# Patient Record
Sex: Male | Born: 1940 | Race: White | Hispanic: No | Marital: Married | Smoking: Former smoker
Health system: Southern US, Community
[De-identification: ages and names within clinical notes are randomized; demographics above are authoritative.]

## PROBLEM LIST (undated history)

## (undated) DIAGNOSIS — M1990 Unspecified osteoarthritis, unspecified site: Secondary | ICD-10-CM

## (undated) DIAGNOSIS — H269 Unspecified cataract: Secondary | ICD-10-CM

## (undated) DIAGNOSIS — M069 Rheumatoid arthritis, unspecified: Secondary | ICD-10-CM

## (undated) DIAGNOSIS — B019 Varicella without complication: Secondary | ICD-10-CM

## (undated) DIAGNOSIS — K219 Gastro-esophageal reflux disease without esophagitis: Secondary | ICD-10-CM

## (undated) DIAGNOSIS — E785 Hyperlipidemia, unspecified: Secondary | ICD-10-CM

## (undated) HISTORY — PX: CATARACT EXTRACTION: SUR2

## (undated) HISTORY — PX: TONSILLECTOMY: SUR1361

---

## 2006-09-24 ENCOUNTER — Ambulatory Visit: Payer: Self-pay | Admitting: Radiation Oncology

## 2008-03-21 ENCOUNTER — Emergency Department: Payer: Self-pay | Admitting: Emergency Medicine

## 2008-03-29 ENCOUNTER — Emergency Department: Payer: Self-pay | Admitting: Emergency Medicine

## 2009-04-19 ENCOUNTER — Ambulatory Visit: Payer: Self-pay | Admitting: Ophthalmology

## 2009-04-23 ENCOUNTER — Ambulatory Visit: Payer: Self-pay | Admitting: Ophthalmology

## 2011-11-13 ENCOUNTER — Ambulatory Visit: Payer: Self-pay | Admitting: Ophthalmology

## 2011-11-13 DIAGNOSIS — I499 Cardiac arrhythmia, unspecified: Secondary | ICD-10-CM

## 2011-11-28 ENCOUNTER — Ambulatory Visit: Payer: Self-pay | Admitting: Ophthalmology

## 2012-10-22 HISTORY — PX: TRIGGER FINGER RELEASE: SHX641

## 2013-10-19 ENCOUNTER — Emergency Department: Payer: Self-pay | Admitting: Emergency Medicine

## 2013-10-19 LAB — URINALYSIS, COMPLETE
Bilirubin,UR: NEGATIVE
Blood: NEGATIVE
Glucose,UR: NEGATIVE mg/dL (ref 0–75)
KETONE: NEGATIVE
Leukocyte Esterase: NEGATIVE
Nitrite: NEGATIVE
PROTEIN: NEGATIVE
Ph: 5 (ref 4.5–8.0)
Specific Gravity: 1.025 (ref 1.003–1.030)

## 2013-10-19 LAB — CBC WITH DIFFERENTIAL/PLATELET
Basophil #: 0.1 10*3/uL (ref 0.0–0.1)
Basophil %: 0.4 %
EOS PCT: 0.7 %
Eosinophil #: 0.1 10*3/uL (ref 0.0–0.7)
HCT: 49.3 % (ref 40.0–52.0)
HGB: 16.4 g/dL (ref 13.0–18.0)
Lymphocyte #: 1.4 10*3/uL (ref 1.0–3.6)
Lymphocyte %: 7.1 %
MCH: 31.5 pg (ref 26.0–34.0)
MCHC: 33.2 g/dL (ref 32.0–36.0)
MCV: 95 fL (ref 80–100)
Monocyte #: 1.2 x10 3/mm — ABNORMAL HIGH (ref 0.2–1.0)
Monocyte %: 6 %
Neutrophil #: 16.5 10*3/uL — ABNORMAL HIGH (ref 1.4–6.5)
Neutrophil %: 85.8 %
Platelet: 258 10*3/uL (ref 150–440)
RBC: 5.19 10*6/uL (ref 4.40–5.90)
RDW: 13.3 % (ref 11.5–14.5)
WBC: 19.2 10*3/uL — ABNORMAL HIGH (ref 3.8–10.6)

## 2013-10-19 LAB — COMPREHENSIVE METABOLIC PANEL
ALK PHOS: 98 U/L
Albumin: 4.2 g/dL (ref 3.4–5.0)
Anion Gap: 9 (ref 7–16)
BUN: 26 mg/dL — AB (ref 7–18)
Bilirubin,Total: 0.9 mg/dL (ref 0.2–1.0)
CHLORIDE: 106 mmol/L (ref 98–107)
Calcium, Total: 9.6 mg/dL (ref 8.5–10.1)
Co2: 25 mmol/L (ref 21–32)
Creatinine: 1.44 mg/dL — ABNORMAL HIGH (ref 0.60–1.30)
EGFR (African American): 55 — ABNORMAL LOW
EGFR (Non-African Amer.): 47 — ABNORMAL LOW
Glucose: 108 mg/dL — ABNORMAL HIGH (ref 65–99)
Osmolality: 285 (ref 275–301)
Potassium: 4 mmol/L (ref 3.5–5.1)
SGOT(AST): 33 U/L (ref 15–37)
SGPT (ALT): 31 U/L
Sodium: 140 mmol/L (ref 136–145)
Total Protein: 8.1 g/dL (ref 6.4–8.2)

## 2013-10-19 LAB — LIPASE, BLOOD: Lipase: 95 U/L (ref 73–393)

## 2013-10-19 LAB — CK: CK, Total: 166 U/L

## 2014-07-18 NOTE — Op Note (Signed)
PATIENT NAME:  [name] [name], [name] [name] MR#:  [id number] DATE OF BIRTH:  1940-07-29  DATE OF PROCEDURE:  11/28/2011  PREOPERATIVE DIAGNOSIS:  Cataract, right eye.   POSTOPERATIVE DIAGNOSIS:  Cataract, right eye.  PROCEDURE PERFORMED:  Extracapsular cataract extraction using phacoemulsification with placement of an Alcon [id number], 21.5-diopter posterior chamber lens, serial # [id number].  SURGEON:  Loura Back. Jensyn Shave, MD  ASSISTANT:  None.  ANESTHESIA:  4% lidocaine and 0.75% Marcaine in a 50/50 mixture with 10 units/mL of Hylenex added, given as a peribulbar.  ANESTHESIOLOGIST:  Dr. Kayleen Memos  COMPLICATIONS:  None.  ESTIMATED BLOOD LOSS:  Less than 1 mL.  DESCRIPTION OF PROCEDURE:  The patient was brought to the operating room and given a peribulbar block.  The patient was then prepped and draped in the usual fashion.  The vertical rectus muscles were imbricated using 5-0 silk sutures.  These sutures were then clamped to the sterile drapes as bridle sutures.  A limbal peritomy was performed extending two clock hours and hemostasis was obtained with cautery.  A partial thickness scleral groove was made at the surgical limbus and dissected anteriorly in a lamellar dissection using an Alcon crescent knife.  The anterior chamber was entered superonasally with a Superblade and through the lamellar dissection with a 2.6 mm keratome.  DisCoVisc was used to replace the aqueous and a continuous tear capsulorrhexis was carried out.  Hydrodissection and hydrodelineation were carried out with balanced salt and a 27 gauge canula.  The nucleus was rotated to confirm the effectiveness of the hydrodissection.  Phacoemulsification was carried out using a divide-and-conquer technique.  Total ultrasound time was 2 minutes and 5 seconds with an average power of 20 percent. CDE 42.03.  Irrigation/aspiration was used to remove the residual cortex.  DisCoVisc was used to inflate the capsule and the internal incision was  enlarged to 3 mm with the crescent knife.  The intraocular lens was folded and inserted into the capsular bag using the AcrySert delivery system.  Irrigation/aspiration was used to remove the residual DisCoVisc.  Miostat was injected into the anterior chamber through the paracentesis track to inflate the anterior chamber and induce miosis.  The wound was checked for leaks and none were found. The conjunctiva was closed with cautery and the bridle sutures were removed.  Two drops of 0.3% Vigamox were placed on the eye.   An eye shield was placed on the eye.  The patient was discharged to the recovery room in good condition.  ____________________________ Loura Back Kyrie Bun, MD sad:cms D: 11/28/2011 12:31:11 ET T: 11/28/2011 12:42:30 ET JOB#: [id number]  cc: Remo Lipps A. Malala Trenkamp, MD, <Dictator> Martie Lee MD ELECTRONICALLY SIGNED 12/08/2011 13:37

## 2015-01-04 ENCOUNTER — Other Ambulatory Visit: Payer: Self-pay | Admitting: Gastroenterology

## 2015-01-04 DIAGNOSIS — R634 Abnormal weight loss: Secondary | ICD-10-CM

## 2015-01-04 DIAGNOSIS — R1030 Lower abdominal pain, unspecified: Secondary | ICD-10-CM

## 2015-01-04 DIAGNOSIS — Z9889 Other specified postprocedural states: Secondary | ICD-10-CM

## 2015-01-10 ENCOUNTER — Ambulatory Visit
Admission: RE | Admit: 2015-01-10 | Discharge: 2015-01-10 | Disposition: A | Discharge status: Home / Self Care | Payer: Medicare Other | Source: Ambulatory Visit | Attending: Gastroenterology | Admitting: Gastroenterology

## 2015-01-10 DIAGNOSIS — R102 Pelvic and perineal pain: Secondary | ICD-10-CM | POA: Insufficient documentation

## 2015-01-10 DIAGNOSIS — Z9889 Other specified postprocedural states: Secondary | ICD-10-CM

## 2015-01-10 DIAGNOSIS — R634 Abnormal weight loss: Secondary | ICD-10-CM | POA: Insufficient documentation

## 2015-01-10 DIAGNOSIS — I714 Abdominal aortic aneurysm, without rupture: Secondary | ICD-10-CM | POA: Diagnosis not present

## 2015-01-10 DIAGNOSIS — R1030 Lower abdominal pain, unspecified: Secondary | ICD-10-CM

## 2015-01-10 MED ORDER — IOHEXOL 300 MG/ML  SOLN
100.0000 mL | Freq: Once | INTRAMUSCULAR | Status: AC | PRN
  Administered 2015-01-10: 100 mL via INTRAVENOUS

## 2015-04-05 ENCOUNTER — Encounter: Payer: Self-pay | Admitting: *Deleted

## 2015-04-06 ENCOUNTER — Ambulatory Visit: Payer: Medicare Other | Admitting: Anesthesiology

## 2015-04-06 ENCOUNTER — Ambulatory Visit
Admission: RE | Admit: 2015-04-06 | Discharge: 2015-04-06 | Disposition: A | Discharge status: Home Health | Payer: Medicare Other | Source: Ambulatory Visit | Attending: Gastroenterology | Admitting: Gastroenterology

## 2015-04-06 ENCOUNTER — Encounter
Admission: RE | Disposition: A | Discharge status: Home Health | Payer: Self-pay | Source: Ambulatory Visit | Attending: Gastroenterology

## 2015-04-06 ENCOUNTER — Encounter: Payer: Self-pay | Admitting: *Deleted

## 2015-04-06 DIAGNOSIS — Z87891 Personal history of nicotine dependence: Secondary | ICD-10-CM | POA: Diagnosis not present

## 2015-04-06 DIAGNOSIS — R1030 Lower abdominal pain, unspecified: Secondary | ICD-10-CM | POA: Diagnosis present

## 2015-04-06 DIAGNOSIS — D120 Benign neoplasm of cecum: Secondary | ICD-10-CM | POA: Insufficient documentation

## 2015-04-06 DIAGNOSIS — E785 Hyperlipidemia, unspecified: Secondary | ICD-10-CM | POA: Insufficient documentation

## 2015-04-06 DIAGNOSIS — D125 Benign neoplasm of sigmoid colon: Secondary | ICD-10-CM | POA: Insufficient documentation

## 2015-04-06 DIAGNOSIS — Z79899 Other long term (current) drug therapy: Secondary | ICD-10-CM | POA: Diagnosis not present

## 2015-04-06 DIAGNOSIS — M1990 Unspecified osteoarthritis, unspecified site: Secondary | ICD-10-CM | POA: Insufficient documentation

## 2015-04-06 DIAGNOSIS — K219 Gastro-esophageal reflux disease without esophagitis: Secondary | ICD-10-CM | POA: Diagnosis not present

## 2015-04-06 DIAGNOSIS — D122 Benign neoplasm of ascending colon: Secondary | ICD-10-CM | POA: Insufficient documentation

## 2015-04-06 DIAGNOSIS — M069 Rheumatoid arthritis, unspecified: Secondary | ICD-10-CM | POA: Insufficient documentation

## 2015-04-06 DIAGNOSIS — K5730 Diverticulosis of large intestine without perforation or abscess without bleeding: Secondary | ICD-10-CM | POA: Diagnosis not present

## 2015-04-06 DIAGNOSIS — R634 Abnormal weight loss: Secondary | ICD-10-CM | POA: Diagnosis not present

## 2015-04-06 DIAGNOSIS — D123 Benign neoplasm of transverse colon: Secondary | ICD-10-CM | POA: Insufficient documentation

## 2015-04-06 HISTORY — DX: Varicella without complication: B01.9

## 2015-04-06 HISTORY — DX: Unspecified cataract: H26.9

## 2015-04-06 HISTORY — PX: COLONOSCOPY WITH PROPOFOL: SHX5780

## 2015-04-06 HISTORY — DX: Rheumatoid arthritis, unspecified: M06.9

## 2015-04-06 HISTORY — DX: Gastro-esophageal reflux disease without esophagitis: K21.9

## 2015-04-06 HISTORY — DX: Unspecified osteoarthritis, unspecified site: M19.90

## 2015-04-06 HISTORY — DX: Hyperlipidemia, unspecified: E78.5

## 2015-04-06 SURGERY — COLONOSCOPY WITH PROPOFOL
Anesthesia: General

## 2015-04-06 MED ORDER — PROPOFOL 10 MG/ML IV BOLUS
INTRAVENOUS | Status: DC | PRN
  Administered 2015-04-06: 70 mg via INTRAVENOUS

## 2015-04-06 MED ORDER — LIDOCAINE HCL (CARDIAC) 20 MG/ML IV SOLN
INTRAVENOUS | Status: DC | PRN
  Administered 2015-04-06: 60 mg via INTRAVENOUS

## 2015-04-06 MED ORDER — SODIUM CHLORIDE 0.9 % IV SOLN
INTRAVENOUS | Status: DC
  Administered 2015-04-06: 11:00:00 via INTRAVENOUS

## 2015-04-06 MED ORDER — PROPOFOL 500 MG/50ML IV EMUL
INTRAVENOUS | Status: DC | PRN
  Administered 2015-04-06: 120 ug/kg/min via INTRAVENOUS

## 2015-04-06 MED ORDER — PHENYLEPHRINE HCL 10 MG/ML IJ SOLN
INTRAMUSCULAR | Status: DC | PRN
  Administered 2015-04-06 (×2): 100 ug via INTRAVENOUS

## 2015-04-06 NOTE — Op Note (Signed)
[facility] Gastroenterology Patient Name: [name] [name] Procedure Date: 04/06/2015 11:25 AM MRN: [id number] Account #: [id number] Date of Birth: January 22, 1941 Admit Type: Outpatient Age: 75 Room: [facility] ENDO ROOM 2 Gender: Male Note Status: Finalized Procedure:         Colonoscopy Indications:       Lower abdominal pain(nearly resolved), Weight loss Patient Profile:   This is a 75 year old male. Providers:         Gerrit Heck. Rayann Heman, MD Referring MD:      Tracie Harrier, MD (Referring MD) Medicines:         Propofol per Anesthesia Complications:     No immediate complications. Procedure:         Pre-Anesthesia Assessment:                    - Prior to the procedure, a History and Physical was                     performed, and patient medications, allergies and                     sensitivities were reviewed. The patient's tolerance of                     previous anesthesia was reviewed.                    After obtaining informed consent, the colonoscope was                     passed under direct vision. Throughout the procedure, the                     patient's blood pressure, pulse, and oxygen saturations                     were monitored continuously. The Colonoscope was                     introduced through the anus and advanced to the the cecum,                     identified by appendiceal orifice and ileocecal valve. The                     colonoscopy was performed without difficulty. The patient                     tolerated the procedure well. The quality of the bowel                     preparation was good. Findings:      The perianal and digital rectal examinations were normal.      A 3 mm polyp was found in the cecum. The polyp was sessile. The polyp       was removed with a jumbo cold forceps. Resection and retrieval were       complete.      A 3 mm polyp was found in the ascending colon. The polyp was sessile.       The polyp was removed with a  jumbo cold forceps. Resection and retrieval       were complete.      Four sessile polyps were found in the transverse colon. The polyps were  2 to 4 mm in size. These polyps were removed with a jumbo cold forceps.       Resection and retrieval were complete.      A 4 mm polyp was found in the sigmoid colon. The polyp was sessile. The       polyp was removed with a jumbo cold forceps. Resection and retrieval       were complete.      Many small and large-mouthed diverticula were found in the sigmoid colon.      Internal hemorrhoids were found during retroflexion. The hemorrhoids       were Grade I (internal hemorrhoids that do not prolapse).      The exam was otherwise without abnormality. Impression:        - One 3 mm polyp in the cecum. Resected and retrieved.                    - One 3 mm polyp in the ascending colon. Resected and                     retrieved.                    - Four 2 to 4 mm polyps in the transverse colon. Resected                     and retrieved.                    - One 4 mm polyp in the sigmoid colon. Resected and                     retrieved.                    - Diverticulosis in the sigmoid colon.                    - Internal hemorrhoids.                    - The examination was otherwise normal. Recommendation:    - Observe patient in GI recovery unit.                    - High fiber diet.                    - Continue present medications.                    - Await pathology results.                    - Repeat colonoscopy for surveillance based on pathology                     results.                    - Return to referring physician.                    - Call GI clinic if abd pain returns                    - The findings and recommendations were discussed with the                     patient.                    -  The findings and recommendations were discussed with the                     patient's family. Procedure Code(s): ---  Professional ---                    [phone], Colonoscopy, flexible; with biopsy, single or                     multiple Diagnosis Code(s): --- Professional ---                    D12.0, Benign neoplasm of cecum                    D12.2, Benign neoplasm of ascending colon                    D12.5, Benign neoplasm of sigmoid colon                    D12.3, Benign neoplasm of transverse colon                    K64.0, First degree hemorrhoids                    R10.30, Lower abdominal pain, unspecified                    R63.4, Abnormal weight loss                    K57.30, Diverticulosis of large intestine without                     perforation or abscess without bleeding CPT copyright 2014 American Medical Association. All rights reserved. The codes documented in this report are preliminary and upon coder review may  be revised to meet current compliance requirements. Mellody Life, MD 04/06/2015 11:55:15 AM This report has been signed electronically. Number of Addenda: 0 Note Initiated On: 04/06/2015 11:25 AM Scope Withdrawal Time: 0 hours 16 minutes 45 seconds  Total Procedure Duration: 0 hours 20 minutes 55 seconds       [facility]

## 2015-04-06 NOTE — Transfer of Care (Signed)
Immediate Anesthesia Transfer of Care Note  Patient: [name] [name]  Procedure(s) Performed: Procedure(s): COLONOSCOPY WITH PROPOFOL (N/A)  Patient Location: PACU  Anesthesia Type:General  Level of Consciousness: sedated  Airway & Oxygen Therapy: Patient Spontanous Breathing and Patient connected to nasal cannula oxygen  Post-op Assessment: Report given to RN and Post -op Vital signs reviewed and stable  Post vital signs: Reviewed and stable  Last Vitals:  Filed Vitals:   04/06/15 1100 04/06/15 1159  BP: 122/86 100/64  Pulse: 70 66  Temp: 36.3 C 96.81F  Resp: 18 16    Complications: No apparent anesthesia complications

## 2015-04-06 NOTE — H&P (Signed)
  Primary Care Physician:  Tracie Harrier, MD  Pre-Procedure History & Physical: HPI:  [name] [name] is a 75 y.o. male is here for an colonoscopy.   Past Medical History  Diagnosis Date  . Arthritis   . Cataracts, bilateral   . Chickenpox   . Dyslipidemia   . GERD (gastroesophageal reflux disease)   . Rheumatoid arthritis [facility])     Past Surgical History  Procedure Laterality Date  . Cataract extraction Bilateral 2011; 2013  . Trigger finger release Left 10/22/12    Thumb  . Tonsillectomy      Prior to Admission medications   Medication Sig Start Date End Date Taking? Authorizing Provider  meloxicam (MOBIC) 15 MG tablet Take 15 mg by mouth daily.   Yes Historical Provider, MD  omeprazole (PRILOSEC) 20 MG capsule Take 20 mg by mouth daily.   Yes Historical Provider, MD  Probiotic Product (ALIGN) 4 MG CAPS Take 1 capsule by mouth daily.   Yes Historical Provider, MD    Allergies as of 02/28/2015  . (No Known Allergies)    History reviewed. No pertinent family history.  Social History   Social History  . Marital Status: Married    Spouse Name: N/A  . Number of Children: N/A  . Years of Education: N/A   Occupational History  . Not on file.   Social History Main Topics  . Smoking status: Former Smoker    Quit date: 10/25/1988  . Smokeless tobacco: Not on file  . Alcohol Use: No  . Drug Use: No  . Sexual Activity: Not on file   Other Topics Concern  . Not on file   Social History Narrative     Physical Exam: BP 122/86 mmHg  Pulse 70  Temp(Src) 97.4 F (36.3 C) (Tympanic)  Resp 18  Ht 5\' 11"  (1.803 m)  Wt 81.647 kg (180 lb)  BMI 25.12 kg/m2  SpO2 100% General:   Alert,  pleasant and cooperative in NAD Head:  Normocephalic and atraumatic. Neck:  Supple; no masses or thyromegaly. Lungs:  Clear throughout to auscultation.    Heart:  Regular rate and rhythm. Abdomen:  Soft, nontender and nondistended. Normal bowel sounds, without guarding, and  without rebound.   Neurologic:  Alert and  oriented x4;  grossly normal neurologically.  Impression/Plan: [name] [name] is here for an colonoscopy to be performed for hypogastric pain, wt loss  Risks, benefits, limitations, and alternatives regarding  colonoscopy have been reviewed with the patient.  Questions have been answered.  All parties agreeable.   Josefine Class, MD  04/06/2015, 11:23 AM

## 2015-04-06 NOTE — Anesthesia Preprocedure Evaluation (Signed)
Anesthesia Evaluation  Patient identified by MRN, date of birth, ID band Patient awake    Reviewed: Allergy & Precautions, H&P , NPO status , Patient's Chart, lab work & pertinent test results  History of Anesthesia Complications Negative for: history of anesthetic complications  Airway Mallampati: III  TM Distance: >3 FB Neck ROM: limited    Dental  (+) Poor Dentition, Chipped, Missing, Partial Upper, Partial Lower   Pulmonary neg shortness of breath, former smoker,    Pulmonary exam normal breath sounds clear to auscultation       Cardiovascular Exercise Tolerance: Good (-) angina(-) Past MI and (-) DOE negative cardio ROS Normal cardiovascular exam Rhythm:regular Rate:Normal     Neuro/Psych negative neurological ROS  negative psych ROS   GI/Hepatic Neg liver ROS, GERD  Controlled,  Endo/Other  negative endocrine ROS  Renal/GU negative Renal ROS  negative genitourinary   Musculoskeletal   Abdominal   Peds  Hematology negative hematology ROS (+)   Anesthesia Other Findings Past Medical History:   Arthritis                                                    Cataracts, bilateral                                         Chickenpox                                                   Dyslipidemia                                                 GERD (gastroesophageal reflux disease)                       Rheumatoid arthritis ([place])                                  Past Surgical History:   CATARACT EXTRACTION                             Bilateral 2011; 2013   TRIGGER FINGER RELEASE                          Left 10/22/12        Comment:Thumb   TONSILLECTOMY                                                BMI    Body Mass Index   25.11 kg/m 2      Reproductive/Obstetrics negative OB ROS  Anesthesia Physical Anesthesia Plan  ASA: III  Anesthesia Plan: General    Post-op Pain Management:    Induction:   Airway Management Planned:   Additional Equipment:   Intra-op Plan:   Post-operative Plan:   Informed Consent: I have reviewed the patients History and Physical, chart, labs and discussed the procedure including the risks, benefits and alternatives for the proposed anesthesia with the patient or authorized representative who has indicated his/her understanding and acceptance.   Dental Advisory Given  Plan Discussed with: Anesthesiologist, CRNA and Surgeon  Anesthesia Plan Comments:         Anesthesia Quick Evaluation

## 2015-04-06 NOTE — Anesthesia Postprocedure Evaluation (Signed)
Anesthesia Post Note  Patient: [name] [name]  Procedure(s) Performed: Procedure(s) (LRB): COLONOSCOPY WITH PROPOFOL (N/A)  Patient location during evaluation: Endoscopy Anesthesia Type: General Level of consciousness: awake and alert Pain management: pain level controlled Vital Signs Assessment: post-procedure vital signs reviewed and stable Respiratory status: spontaneous breathing, nonlabored ventilation, respiratory function stable and patient connected to nasal cannula oxygen Cardiovascular status: blood pressure returned to baseline and stable Postop Assessment: no signs of nausea or vomiting Anesthetic complications: no    Last Vitals:  Filed Vitals:   04/06/15 1217 04/06/15 1227  BP: 104/75 125/83  Pulse: 65 59  Temp:    Resp: 11 19    Last Pain: There were no vitals filed for this visit.               Precious Haws Piscitello

## 2015-04-09 LAB — SURGICAL PATHOLOGY

## 2015-04-12 ENCOUNTER — Encounter: Payer: Self-pay | Admitting: Gastroenterology

## 2015-05-25 ENCOUNTER — Other Ambulatory Visit: Admission: EM | Admit: 2015-05-25 | Payer: Self-pay

## 2015-12-05 ENCOUNTER — Emergency Department: Payer: Medicare Other

## 2015-12-05 ENCOUNTER — Emergency Department (HOSPITAL_COMMUNITY)
Admission: EM | Admit: 2015-12-05 | Discharge: 2015-12-05 | Disposition: A | Discharge status: Home / Self Care | Payer: Medicare Other | Source: Home / Self Care | Attending: Emergency Medicine | Admitting: Emergency Medicine

## 2015-12-05 ENCOUNTER — Encounter: Payer: Self-pay | Admitting: Emergency Medicine

## 2015-12-05 DIAGNOSIS — R29898 Other symptoms and signs involving the musculoskeletal system: Secondary | ICD-10-CM

## 2015-12-05 DIAGNOSIS — Y9289 Other specified places as the place of occurrence of the external cause: Secondary | ICD-10-CM

## 2015-12-05 DIAGNOSIS — S4421XA Injury of radial nerve at upper arm level, right arm, initial encounter: Secondary | ICD-10-CM

## 2015-12-05 DIAGNOSIS — Z87891 Personal history of nicotine dependence: Secondary | ICD-10-CM | POA: Insufficient documentation

## 2015-12-05 DIAGNOSIS — X58XXXA Exposure to other specified factors, initial encounter: Secondary | ICD-10-CM | POA: Insufficient documentation

## 2015-12-05 DIAGNOSIS — Y999 Unspecified external cause status: Secondary | ICD-10-CM

## 2015-12-05 DIAGNOSIS — G588 Other specified mononeuropathies: Secondary | ICD-10-CM

## 2015-12-05 DIAGNOSIS — G589 Mononeuropathy, unspecified: Secondary | ICD-10-CM

## 2015-12-05 DIAGNOSIS — Y9389 Activity, other specified: Secondary | ICD-10-CM

## 2015-12-05 DIAGNOSIS — S6991XA Unspecified injury of right wrist, hand and finger(s), initial encounter: Secondary | ICD-10-CM

## 2015-12-05 DIAGNOSIS — I639 Cerebral infarction, unspecified: Secondary | ICD-10-CM | POA: Diagnosis not present

## 2015-12-05 NOTE — ED Triage Notes (Signed)
Pt states when he woke up this AM, he was unable to get his right hand to open or move like he wanted. Pt denies pain to hand. Pt able to move all other extremities, pt and family denies change in speech or mental status.

## 2015-12-05 NOTE — ED Provider Notes (Signed)
[facility] Emergency Department Provider Note   ____________________________________________   First MD Initiated Contact with Patient 12/05/15 1143     (approximate)  I have reviewed the triage vital signs and the nursing notes.   HISTORY  Chief Complaint Hand Injury   HPI [name] [name] is a 75 y.o. male who presents with difficulty moving the fingers of his right hand.  This began when the patient awoke this morning around 0800 and tried to perform ADLs.  Patient called son-in-law, who took him to an urgent care before coming to the ED.  Patient also reported an aching anterior right shoulder pain that began this morning.  He denies any pain in the hand or elsewhere.  He denies any difficulty moving his wrist, elbow, or shoulder.  He denies any loss of sensation.  Patient reports working on a washing machine yesterday, including moving the machine without assistance.  Patient states he was bending over but not resting any weight on his shoulder or hand.  He denies any recent injury or trauma.  He denies any pain or difficulty with movements yesterday.  Patient has history of a rotator cuff tear in his right shoulder several years ago, for which he did not undergo surgical repair.    Past Medical History:  Diagnosis Date  . Arthritis   . Cataracts, bilateral   . Chickenpox   . Dyslipidemia   . GERD (gastroesophageal reflux disease)   . Rheumatoid arthritis ([place])     There are no active problems to display for this patient.   Past Surgical History:  Procedure Laterality Date  . CATARACT EXTRACTION Bilateral 2011; 2013  . COLONOSCOPY WITH PROPOFOL N/A 04/06/2015   Procedure: COLONOSCOPY WITH PROPOFOL;  Surgeon: Josefine Class, MD;  Location: [facility] ENDOSCOPY;  Service: Endoscopy;  Laterality: N/A;  . TONSILLECTOMY    . TRIGGER FINGER RELEASE Left 10/22/12   Thumb    Prior to Admission medications   Medication Sig Start Date End Date Taking?  Authorizing Provider  meloxicam (MOBIC) 15 MG tablet Take 15 mg by mouth daily.    Historical Provider, MD  omeprazole (PRILOSEC) 20 MG capsule Take 20 mg by mouth daily.    Historical Provider, MD  Probiotic Product (ALIGN) 4 MG CAPS Take 1 capsule by mouth daily.    Historical Provider, MD    Allergies Review of patient's allergies indicates no known allergies.  History reviewed. No pertinent family history.  Social History Social History  Substance Use Topics  . Smoking status: Former Smoker    Quit date: 10/25/1988  . Smokeless tobacco: Never Used  . Alcohol use No    Review of Systems  Constitutional: No fever/chills Eyes: No visual changes. Cardiovascular: Denies chest pain. Respiratory: Denies shortness of breath. Musculoskeletal: Negative for back pain.  Endorses pain of the right anterior shoulder.  Skin: Negative for rash. Neurological: Negative for headaches or numbness.  Admits focal weakness of the right hand and inability to move fingers of right hand.  Denies loss of consciousness. Denies loss of sensation.     10-point ROS otherwise negative.  ____________________________________________   PHYSICAL EXAM:  VITAL SIGNS: ED Triage Vitals  Enc Vitals Group     BP 12/05/15 1058 (!) 121/58     Pulse Rate 12/05/15 1058 67     Resp 12/05/15 1058 20     Temp 12/05/15 1058 97.6 F (36.4 C)     Temp Source 12/05/15 1058 Oral  SpO2 12/05/15 1058 97 %     Weight 12/05/15 1059 180 lb (81.6 kg)     Height 12/05/15 1059 5\' 11"  (1.803 m)     Head Circumference --      Peak Flow --      Pain Score --      Pain Loc --      Pain Edu? --      Excl. in [place]? --     Constitutional: Alert and oriented. Well appearing and in no acute distress. Eyes: Conjunctivae are normal. PERRL. EOMI. Head: Atraumatic. Nose: No congestion/rhinnorhea. Neck: No stridor.   Cardiovascular: Normal rate, regular rhythm. Grossly normal heart sounds.  Good peripheral  circulation. Respiratory: Normal respiratory effort.  No retractions. Lungs CTAB. Musculoskeletal: Examination of the left shoulder there is no gross deformity and range of motion is without restriction. On examination of the elbow there is no gross deformity or soft tissue swelling. There is no erythema or abrasions noted. Range of motion in the elbow is without restriction. There is no tenderness on palpation in either of these areas. On examination of the right wrist and hand there is no gross deformity. Wrist patient is able to move flexion and extension without any difficulty. The digits however can manually be extended but remain in flexion constantly. There is no swelling or erythema of the digits. There is no swelling or evidence of trauma at the wrist. Neurologic:  Normal speech and language. No gross focal neurologic deficits are appreciated. No gait instability. Skin:  Skin is warm, dry and intact. No rash noted. Psychiatric: Mood and affect are normal. Speech and behavior are normal.  ____________________________________________   LABS (all labs ordered are listed, but only abnormal results are displayed)  Labs Reviewed - No data to display  RADIOLOGY  Right hand x-ray per radiologist: IMPRESSION:  1. No fracture or malalignment.  2. 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   I, Johnn Hai, personally viewed and evaluated these images (plain radiographs) as part of my medical decision making, as well as reviewing the written report by the radiologist.   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 Osteophytic  encroachment upon the canal and foramina. Foraminal narrowing could  possibly affect the C5 nerve roots. This has worsened since 2009.    Advanced chronic degenerative spondylosis at C6-7. Mild foraminal  narrowing by  osteophytes without visible neural compression. This  has worsened since 2009.    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 No  compressive stenosis. This has worsened since 2009.    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    ____________________________________________   PROCEDURES  Procedure(s) performed: None  Procedures  Critical Care performed: No  ____________________________________________   INITIAL IMPRESSION / ASSESSMENT AND PLAN / ED COURSE  Pertinent labs & imaging results that were available during my care of the patient were reviewed by me and considered in my medical decision making (see chart for details).    Clinical Course  While in the emergency room Dr. Cinda Quest also came to evaluate the patient after right hand x-rays showed osteoarthritis family. Dr. Doy Mince, neurologist on site at the hospital, also saw the patient for evaluation and ordered a CT of the cervical spine. After results were reviewed Dr. Doy Mince asked that patient be referred to neurology for further evaluation and nerve  conduction study. Patient was to be placed in an OCL splint for support. Dr. Doy Mince requested occupational therapy however appointment was not able to be made due to the department not taking referrals from the emergency room and this would need to be done by the doctor following the patient.  An appointment with Dr. Melrose Nakayama was obtained for the patient. Patient will be seen on 12/06/15 at 11 AM. Patient is to go to the [facility] office for this apppoinment. Patient will continue taking his regular medication until that time.   ____________________________________________   FINAL CLINICAL IMPRESSION(S) / ED DIAGNOSES  Final diagnoses:  Nerve palsy  Hand injury, right, initial encounter      NEW MEDICATIONS STARTED DURING THIS VISIT:  Discharge Medication List as of 12/05/2015  4:02 PM       Note:  This document was  prepared using Dragon voice recognition software and may include unintentional dictation errors.    Johnn Hai, PA-C 12/05/15 [place] Malinda, MD 12/12/15 [phone]

## 2015-12-05 NOTE — ED Provider Notes (Signed)
Discussed in detail with Dr. Doy Mince neurologist. She recommends OT with for exercising and splints follow with neurology for nerve conduction. She has reviewed the CT scan which she ordered herself and does not feel that that explains what is going on with this gentleman she thinks is more likely a peripheral problem and therefore the recommendation she made. Letitia Neri will take care of the arrangements patient will return for any further worsening.   Nena Polio, MD 12/05/15 [phone]

## 2015-12-05 NOTE — Consult Note (Signed)
Reason for Consult:Hand weakness Referring Physician: Cinda Quest  CC: Hand weakness  HPI: [name] [name] is an 75 y.o. male with a history of RA on no medications who reports that he was doing some work around the house yesterday and had no complaints.  Today awakened and was unable to extend his hand.  There was no pain.  There was no swelling.  There are no sensory complaints.  Patient presented for evaluation with no improvement in his symptoms.   Patient reports neck and shoulder pain for some time.    Past Medical History:  Diagnosis Date  . Arthritis   . Cataracts, bilateral   . Chickenpox   . Dyslipidemia   . GERD (gastroesophageal reflux disease)   . Rheumatoid arthritis [facility])     Past Surgical History:  Procedure Laterality Date  . CATARACT EXTRACTION Bilateral 2011; 2013  . COLONOSCOPY WITH PROPOFOL N/A 04/06/2015   Procedure: COLONOSCOPY WITH PROPOFOL;  Surgeon: Josefine Class, MD;  Location: [facility] ENDOSCOPY;  Service: Endoscopy;  Laterality: N/A;  . TONSILLECTOMY    . TRIGGER FINGER RELEASE Left 10/22/12   Thumb    Family history: Parents deceased.  Father of old age and mother with stroke.  Daughter with brain cancer.    Social History:  reports that he quit smoking about 27 years ago. He has never used smokeless tobacco. He reports that he does not drink alcohol or use drugs.  No Known Allergies  Medications: None  ROS: History obtained from the patient  General ROS: negative for - chills, fatigue, fever, night sweats, weight gain or weight loss Psychological ROS: negative for - behavioral disorder, hallucinations, memory difficulties, mood swings or suicidal ideation Ophthalmic ROS: negative for - blurry vision, double vision, eye pain or loss of vision ENT ROS: negative for - epistaxis, nasal discharge, oral lesions, sore throat, tinnitus or vertigo Allergy and Immunology ROS: negative for - hives or itchy/watery eyes Hematological and Lymphatic ROS:  negative for - bleeding problems, bruising or swollen lymph nodes Endocrine ROS: negative for - galactorrhea, hair pattern changes, polydipsia/polyuria or temperature intolerance Respiratory ROS: negative for - cough, hemoptysis, shortness of breath or wheezing Cardiovascular ROS: negative for - chest pain, dyspnea on exertion, edema or irregular heartbeat Gastrointestinal ROS: negative for - abdominal pain, diarrhea, hematemesis, nausea/vomiting or stool incontinence Genito-Urinary ROS: negative for - dysuria, hematuria, incontinence or urinary frequency/urgency Musculoskeletal ROS: as noted in HPI Neurological ROS: as noted in HPI Dermatological ROS: negative for rash and skin lesion changes  Physical Examination: Blood pressure (!) 121/58, pulse 67, temperature 97.6 F (36.4 C), temperature source Oral, resp. rate 20, height 5\' 11"  (1.803 m), weight 81.6 kg (180 lb), SpO2 97 %.  HEENT-  Normocephalic, no lesions, without obvious abnormality.  Normal external eye and conjunctiva.  Normal TM's bilaterally.  Normal auditory canals and external ears. Normal external nose, mucus membranes and septum.  Normal pharynx. Cardiovascular- S1, S2 normal, pulses palpable throughout   Lungs- chest clear, no wheezing, rales, normal symmetric air entry Abdomen- soft, non-tender; bowel sounds normal; no masses,  no organomegaly Extremities- no edema Lymph-no adenopathy palpable Musculoskeletal-no joint tenderness, deformity or swelling Skin-warm and dry, no hyperpigmentation, vitiligo, or suspicious lesions  Neurological Examination Mental Status: Alert, oriented, thought content appropriate.  Speech fluent without evidence of aphasia.  Able to follow 3 step commands without difficulty. Cranial Nerves: II: Discs flat bilaterally; Visual fields grossly normal, pupils equal, round, reactive to light and accommodation III,IV, VI: ptosis not present,  extra-ocular motions intact bilaterally V,VII: smile  symmetric, facial light touch sensation normal bilaterally VIII: hearing normal bilaterally IX,X: gag reflex present XI: bilateral shoulder shrug XII: midline tongue extension Motor: Left upper extremity with 5/5 strength.  Right upper extremity 5/5 deltoid, biceps, triceps, wrist extension, hand grip.  Patient unable to extend fingers with last three digits being the most affected.  Some limitation in ulnar deviation Sensory: Pinprick and light touch intact throughout, bilaterally Deep Tendon Reflexes: 2+ and symmetric throughout   Laboratory Studies:   Basic Metabolic Panel: No results for input(s): NA, K, CL, CO2, GLUCOSE, BUN, CREATININE, CALCIUM, MG, PHOS in the last 168 hours.  Liver Function Tests: No results for input(s): AST, ALT, ALKPHOS, BILITOT, PROT, ALBUMIN in the last 168 hours. No results for input(s): LIPASE, AMYLASE in the last 168 hours. No results for input(s): AMMONIA in the last 168 hours.  CBC: No results for input(s): WBC, NEUTROABS, HGB, HCT, MCV, PLT in the last 168 hours.  Cardiac Enzymes: No results for input(s): CKTOTAL, CKMB, CKMBINDEX, TROPONINI in the last 168 hours.  BNP: Invalid input(s): POCBNP  CBG: No results for input(s): GLUCAP in the last 168 hours.  Microbiology: No results found for this or any previous visit.  Coagulation Studies: No results for input(s): LABPROT, INR in the last 72 hours.  Urinalysis: No results for input(s): COLORURINE, LABSPEC, PHURINE, GLUCOSEU, HGBUR, BILIRUBINUR, KETONESUR, PROTEINUR, UROBILINOGEN, NITRITE, LEUKOCYTESUR in the last 168 hours.  Invalid input(s): APPERANCEUR  Lipid Panel:  No results found for: CHOL, TRIG, HDL, CHOLHDL, VLDL, LDLCALC  HgbA1C: No results found for: HGBA1C  Urine Drug Screen:  No results found for: LABOPIA, COCAINSCRNUR, LABBENZ, AMPHETMU, THCU, LABBARB  Alcohol Level: No results for input(s): ETH in the last 168 hours.   Imaging: Ct Cervical Spine Wo Contrast  Result  Date: 12/05/2015 CLINICAL DATA:  Chronic neck pain.  Numbness in the right hand. EXAM: CT CERVICAL SPINE WITHOUT CONTRAST TECHNIQUE: Multidetector CT imaging of the cervical spine was performed without intravenous contrast. Multiplanar CT image reconstructions were also generated. COMPARISON:  03/21/2008 FINDINGS: Alignment: Loss of normal cervical lordosis. Skull base and vertebrae: No fracture or focal destructive lesion. Soft tissues and spinal canal: Carotid atherosclerosis. No other soft tissue finding. Disc levels:  C1-2:  Ordinary osteoarthritis.  No canal stenosis. C2-3:  Chronic ankylosis.  Wide patency of the canal and foramina. C3-4:  Chronic ankylosis.  Wide patency of the canal and foramina. C4-5: Near fusion. Complete loss of disc height. Mild osteophytic encroachment upon the canal and foramina. Some potential for irritation of the exiting C5 nerve roots. C5-6:  Chronic ankylosis.  Wide patency of the canal and foramina. C6-7: Chronic disc degeneration. Small endplate osteophytes. Mild foraminal narrowing. C7-T1: Bilateral facet osteoarthritis with 2 mm of anterolisthesis. No canal or foraminal stenosis. 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 Osteophytic encroachment upon the canal and foramina. Foraminal narrowing could possibly affect the C5 nerve roots. This has worsened since 2009. Advanced chronic degenerative spondylosis at C6-7. Mild foraminal narrowing by osteophytes without visible neural compression. This has worsened since 2009. 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 No compressive stenosis. This has worsened since 2009. 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 Electronically Signed   By: Nelson Chimes M.D.   On: 12/05/2015 14:35   Dg Hand Complete Right  Result Date: 12/05/2015 CLINICAL DATA:  Limited range of  motion of the right  fingers. No reported injury. EXAM: RIGHT HAND - COMPLETE 3+ VIEW COMPARISON:  None. FINDINGS: No dislocation or suspicious focal osseous lesion. Moderate osteoarthritis at the first carpometacarpal joint. Mild osteoarthritis at third metacarpophalangeal joint. Mild-to-moderate osteoarthritis at the distal interphalangeal joint of the right second finger. No evidence of erosive arthropathy. No radiopaque body. IMPRESSION: 1. No fracture or malalignment. 2. 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 Electronically Signed   By: Ilona Sorrel M.D.   On: 12/05/2015 12:40     Assessment/Plan: 75 year old male with an exam consistent with posterior interosseous injury or entrapment.  Patient without sensory symptoms.  With history of neck pain and RA so will rule out a cervical etiology.  Further work up recommended.  Recommendations: 1.  CT of the cervical spine 2.  OT evaluation  3.  Outpatient appointment with neurology for NCV/EMG of the RUE  Alexis Goodell, MD Neurology [phone] 12/05/2015, 2:57 PM   Addendum: CT of the cervical spine reviewed and shows multiple levels of degenerative spondylosis and osteophytes but nothing to explain patient's presentation.  Will continue with above plan.    Case discussed with Dr. Cinda Quest.    Alexis Goodell, MD Neurology [phone]

## 2015-12-05 NOTE — ED Notes (Signed)
AAOx3.  Skin warm and dry. NAD.  Ambulates independently with an easy and steady gait.

## 2015-12-05 NOTE — Discharge Instructions (Signed)
Your appointment with Dr. Melrose Nakayama, neurologist, on 9/7 at 11 AM. This appointment is at the medicine office at [facility] [address], [place] This office complex is located behind [employer] and across from the [address] [place] in [place].    Wear splint for support until seen by Dr. Melrose Nakayama. Continue regular medication as prescribed by your primary care doctor.

## 2015-12-06 ENCOUNTER — Inpatient Hospital Stay
Admission: EM | Admit: 2015-12-06 | Discharge: 2015-12-07 | DRG: 066 | Disposition: A | Discharge status: Home / Self Care | Payer: Medicare Other | Attending: Internal Medicine | Admitting: Internal Medicine

## 2015-12-06 ENCOUNTER — Emergency Department: Payer: Medicare Other

## 2015-12-06 ENCOUNTER — Encounter: Payer: Self-pay | Admitting: Emergency Medicine

## 2015-12-06 DIAGNOSIS — I63322 Cerebral infarction due to thrombosis of left anterior cerebral artery: Secondary | ICD-10-CM

## 2015-12-06 DIAGNOSIS — E785 Hyperlipidemia, unspecified: Secondary | ICD-10-CM | POA: Diagnosis present

## 2015-12-06 DIAGNOSIS — G8321 Monoplegia of upper limb affecting right dominant side: Secondary | ICD-10-CM | POA: Diagnosis present

## 2015-12-06 DIAGNOSIS — I10 Essential (primary) hypertension: Secondary | ICD-10-CM | POA: Diagnosis present

## 2015-12-06 DIAGNOSIS — I639 Cerebral infarction, unspecified: Principal | ICD-10-CM | POA: Diagnosis present

## 2015-12-06 DIAGNOSIS — M479 Spondylosis, unspecified: Secondary | ICD-10-CM | POA: Diagnosis present

## 2015-12-06 DIAGNOSIS — Z823 Family history of stroke: Secondary | ICD-10-CM

## 2015-12-06 DIAGNOSIS — M069 Rheumatoid arthritis, unspecified: Secondary | ICD-10-CM | POA: Diagnosis present

## 2015-12-06 DIAGNOSIS — Z87891 Personal history of nicotine dependence: Secondary | ICD-10-CM | POA: Diagnosis not present

## 2015-12-06 DIAGNOSIS — K219 Gastro-esophageal reflux disease without esophagitis: Secondary | ICD-10-CM | POA: Diagnosis present

## 2015-12-06 DIAGNOSIS — Z791 Long term (current) use of non-steroidal anti-inflammatories (NSAID): Secondary | ICD-10-CM | POA: Diagnosis not present

## 2015-12-06 LAB — CBC WITH DIFFERENTIAL/PLATELET
BASOS PCT: 1 %
Basophils Absolute: 0 10*3/uL (ref 0–0.1)
Eosinophils Absolute: 0.1 10*3/uL (ref 0–0.7)
Eosinophils Relative: 1 %
HEMATOCRIT: 42.4 % (ref 40.0–52.0)
HEMOGLOBIN: 14.8 g/dL (ref 13.0–18.0)
LYMPHS ABS: 2.1 10*3/uL (ref 1.0–3.6)
LYMPHS PCT: 26 %
MCH: 32.6 pg (ref 26.0–34.0)
MCHC: 34.9 g/dL (ref 32.0–36.0)
MCV: 93.3 fL (ref 80.0–100.0)
MONOS PCT: 7 %
Monocytes Absolute: 0.5 10*3/uL (ref 0.2–1.0)
NEUTROS ABS: 5.4 10*3/uL (ref 1.4–6.5)
NEUTROS PCT: 65 %
Platelets: 224 10*3/uL (ref 150–440)
RBC: 4.55 MIL/uL (ref 4.40–5.90)
RDW: 13.5 % (ref 11.5–14.5)
WBC: 8.2 10*3/uL (ref 3.8–10.6)

## 2015-12-06 LAB — ETHANOL: Alcohol, Ethyl (B): <5 mg/dL (ref <5)

## 2015-12-06 LAB — URINE DRUG SCREEN, QUALITATIVE (ARMC ONLY)
AMPHETAMINES, UR SCREEN: NOT DETECTED
BARBITURATES, UR SCREEN: NOT DETECTED
Benzodiazepine, Ur Scrn: NOT DETECTED
COCAINE METABOLITE, UR ~~LOC~~: NOT DETECTED
Cannabinoid 50 Ng, Ur ~~LOC~~: NOT DETECTED
MDMA (Ecstasy)Ur Screen: NOT DETECTED
METHADONE SCREEN, URINE: NOT DETECTED
Opiate, Ur Screen: NOT DETECTED
Phencyclidine (PCP) Ur S: NOT DETECTED
TRICYCLIC, UR SCREEN: NOT DETECTED

## 2015-12-06 LAB — COMPREHENSIVE METABOLIC PANEL
ALBUMIN: 4.4 g/dL (ref 3.5–5.0)
ALT: 17 U/L (ref 17–63)
ANION GAP: 6 (ref 5–15)
AST: 29 U/L (ref 15–41)
Alkaline Phosphatase: 75 U/L (ref 38–126)
BUN: 26 mg/dL — ABNORMAL HIGH (ref 6–20)
CHLORIDE: 108 mmol/L (ref 101–111)
CO2: 25 mmol/L (ref 22–32)
Calcium: 9.2 mg/dL (ref 8.9–10.3)
Creatinine, Ser: 1.19 mg/dL (ref 0.61–1.24)
GFR calc non Af Amer: 58 mL/min — ABNORMAL LOW (ref >60)
GLUCOSE: 96 mg/dL (ref 65–99)
POTASSIUM: 4 mmol/L (ref 3.5–5.1)
SODIUM: 139 mmol/L (ref 135–145)
Total Bilirubin: 1.2 mg/dL (ref 0.3–1.2)
Total Protein: 7.1 g/dL (ref 6.5–8.1)

## 2015-12-06 LAB — PROTIME-INR
INR: 1.02
Prothrombin Time: 13.4 seconds (ref 11.4–15.2)

## 2015-12-06 LAB — URINALYSIS COMPLETE WITH MICROSCOPIC (ARMC ONLY)
BACTERIA UA: NONE SEEN
BILIRUBIN URINE: NEGATIVE
GLUCOSE, UA: NEGATIVE mg/dL
HGB URINE DIPSTICK: NEGATIVE
LEUKOCYTES UA: NEGATIVE
NITRITE: NEGATIVE
Protein, ur: NEGATIVE mg/dL
SPECIFIC GRAVITY, URINE: 1.013 (ref 1.005–1.030)
Squamous Epithelial / LPF: NONE SEEN
pH: 5 (ref 5.0–8.0)

## 2015-12-06 LAB — TROPONIN I

## 2015-12-06 MED ORDER — PANTOPRAZOLE SODIUM 40 MG PO TBEC
40.0000 mg | DELAYED_RELEASE_TABLET | Freq: Every day | ORAL | Status: DC
  Administered 2015-12-07: 11:00:00 40 mg via ORAL
  Filled 2015-12-06: qty 1

## 2015-12-06 MED ORDER — ASPIRIN EC 81 MG PO TBEC
81.0000 mg | DELAYED_RELEASE_TABLET | Freq: Every day | ORAL | Status: DC
  Administered 2015-12-07: 81 mg via ORAL
  Filled 2015-12-06: qty 1

## 2015-12-06 MED ORDER — ENOXAPARIN SODIUM 30 MG/0.3ML ~~LOC~~ SOLN: 30.0000 mg | SUBCUTANEOUS | Status: DC

## 2015-12-06 MED ORDER — ENOXAPARIN SODIUM 40 MG/0.4ML ~~LOC~~ SOLN: 40.0000 mg | SUBCUTANEOUS | Status: DC

## 2015-12-06 MED ORDER — ATORVASTATIN CALCIUM 20 MG PO TABS: 40.0000 mg | ORAL_TABLET | Freq: Every day | ORAL | Status: DC

## 2015-12-06 MED ORDER — STROKE: EARLY STAGES OF RECOVERY BOOK
Freq: Once | Status: AC
  Administered 2015-12-07: 01:00:00

## 2015-12-06 MED ORDER — ASPIRIN 81 MG PO CHEW
324.0000 mg | CHEWABLE_TABLET | Freq: Once | ORAL | Status: AC
  Administered 2015-12-06: 324 mg via ORAL
  Filled 2015-12-06: qty 4

## 2015-12-06 NOTE — H&P (Signed)
[place] [facility] [place] NAME: [name] [name]    MR#:  [id number]  DATE OF BIRTH:  02/25/1941  DATE OF ADMISSION:  12/06/2015  PRIMARY CARE PHYSICIAN: Tracie Harrier, MD   REQUESTING/REFERRING PHYSICIAN: Dr. Les Pou  CHIEF COMPLAINT:   Chief Complaint  Patient presents with  . Numbness    HISTORY OF PRESENT ILLNESS:  [name] [name]  is a 75 y.o. male. Patient was sent into the ER after a MRI of the brain showed a stroke. The patient's been having trouble moving his right hand since yesterday morning. He came to the ER yesterday and was thought to have a nerve palsy and referred to Dr. Melrose Nakayama who saw him today who ordered an MRI of the brain. The patient spent most the time yelling at me saying that he did not think he had a stroke. At first he refused to get any further testing. After family arrived he changed his mind to get the testing done while he is here.  PAST MEDICAL HISTORY:   Past Medical History:  Diagnosis Date  . Arthritis   . Cataracts, bilateral   . Chickenpox   . Dyslipidemia   . GERD (gastroesophageal reflux disease)   . Rheumatoid arthritis ([place])     PAST SURGICAL HISTORY:   Past Surgical History:  Procedure Laterality Date  . CATARACT EXTRACTION Bilateral 2011; 2013  . COLONOSCOPY WITH PROPOFOL N/A 04/06/2015   Procedure: COLONOSCOPY WITH PROPOFOL;  Surgeon: Josefine Class, MD;  Location: [facility];  Service: Endoscopy;  Laterality: N/A;  . TONSILLECTOMY    . TRIGGER FINGER RELEASE Left 10/22/12   Thumb    SOCIAL HISTORY:   Social History  Substance Use Topics  . Smoking status: Former Smoker    Quit date: 10/25/1988  . Smokeless tobacco: Never Used  . Alcohol use No    FAMILY HISTORY:   Family History  Problem Relation Age of Onset  . Stroke Mother     DRUG ALLERGIES:  No Known Allergies  REVIEW OF SYSTEMS:  CONSTITUTIONAL: No fever, fatigue or weakness.  EYES: No blurred or  double vision. Wears reading glasses EARS, NOSE, AND THROAT: No tinnitus or ear pain. No sore throat. Decreased hearing right ear RESPIRATORY: No cough, shortness of breath, wheezing or hemoptysis.  CARDIOVASCULAR: No chest pain, orthopnea, edema.  GASTROINTESTINAL: No nausea, vomiting, diarrhea or abdominal pain. No blood in bowel movements GENITOURINARY: No dysuria, hematuria.  ENDOCRINE: No polyuria, nocturia,  HEMATOLOGY: No anemia, easy bruising or bleeding SKIN: No rash or lesion. MUSCULOSKELETAL: No joint pain or arthritis.   NEUROLOGIC: Weakness right hand PSYCHIATRY: No anxiety or depression.   MEDICATIONS AT HOME:   Prior to Admission medications   Medication Sig Start Date End Date Taking? Authorizing Provider  meloxicam (MOBIC) 15 MG tablet Take 15 mg by mouth daily.    Historical Provider, MD  omeprazole (PRILOSEC) 20 MG capsule Take 20 mg by mouth as needed.     Historical Provider, MD  Probiotic Product (ALIGN) 4 MG CAPS Take 1 capsule by mouth daily.    Historical Provider, MD      VITAL SIGNS:  Blood pressure 139/82, pulse 66, temperature 97.6 F (36.4 C), resp. rate 18, height 5\' 11"  (1.803 m), weight 81.2 kg (179 lb), SpO2 98 %.  PHYSICAL EXAMINATION:  GENERAL:  75 y.o.-year-old patient lying in the bed with no acute distress.  EYES: Pupils equal, round, reactive to light and accommodation. No scleral icterus. Extraocular  muscles intact.  HEENT: Head atraumatic, normocephalic. Oropharynx and nasopharynx clear.  NECK:  Supple, no jugular venous distention. No thyroid enlargement, no tenderness.  LUNGS: Normal breath sounds bilaterally, no wheezing, rales,rhonchi or crepitation. No use of accessory muscles of respiration.  CARDIOVASCULAR: S1, S2 normal. No murmurs, rubs, or gallops.  ABDOMEN: Soft, nontender, nondistended. Bowel sounds present. No organomegaly or mass.  EXTREMITIES: No pedal edema, cyanosis, or clubbing.  NEUROLOGIC: Cranial nerves II through XII  are intact. Patient unable to extend his fingers on the right hand. He is able to grip with the right hand. Unable to do rapid finger movements with the right hand. Strength at the elbow and shoulder on the right hand 5 out of 5. Other extremities all 5 out of 5 power. Sensation intact to light touch.  PSYCHIATRIC: The patient is alert.  SKIN: No rash, lesion, or ulcer.   LABORATORY PANEL:   CBC  Recent Labs Lab 12/06/15 1555  WBC 8.2  HGB 14.8  HCT 42.4  PLT 224   ------------------------------------------------------------------------------------------------------------------  Chemistries   Recent Labs Lab 12/06/15 1555  NA 139  K 4.0  CL 108  CO2 25  GLUCOSE 96  BUN 26*  CREATININE 1.19  CALCIUM 9.2  AST 29  ALT 17  ALKPHOS 75  BILITOT 1.2   ------------------------------------------------------------------------------------------------------------------  Cardiac Enzymes  Recent Labs Lab 12/06/15 1555  TROPONINI <0.03   ------------------------------------------------------------------------------------------------------------------  RADIOLOGY:  Ct Cervical Spine Wo Contrast  Result Date: 12/05/2015 CLINICAL DATA:  Chronic neck pain.  Numbness in the right hand. EXAM: CT CERVICAL SPINE WITHOUT CONTRAST TECHNIQUE: Multidetector CT imaging of the cervical spine was performed without intravenous contrast. Multiplanar CT image reconstructions were also generated. COMPARISON:  03/21/2008 FINDINGS: Alignment: Loss of normal cervical lordosis. Skull base and vertebrae: No fracture or focal destructive lesion. Soft tissues and spinal canal: Carotid atherosclerosis. No other soft tissue finding. Disc levels:  C1-2:  Ordinary osteoarthritis.  No canal stenosis. C2-3:  Chronic ankylosis.  Wide patency of the canal and foramina. C3-4:  Chronic ankylosis.  Wide patency of the canal and foramina. C4-5: Near fusion. Complete loss of disc height. Mild osteophytic encroachment upon  the canal and foramina. Some potential for irritation of the exiting C5 nerve roots. C5-6:  Chronic ankylosis.  Wide patency of the canal and foramina. C6-7: Chronic disc degeneration. Small endplate osteophytes. Mild foraminal narrowing. C7-T1: Bilateral facet osteoarthritis with 2 mm of anterolisthesis. No canal or foraminal stenosis. 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 Osteophytic encroachment upon the canal and foramina. Foraminal narrowing could possibly affect the C5 nerve roots. This has worsened since 2009. Advanced chronic degenerative spondylosis at C6-7. Mild foraminal narrowing by osteophytes without visible neural compression. This has worsened since 2009. 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 No compressive stenosis. This has worsened since 2009. 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 Electronically Signed   By: Nelson Chimes M.D.   On: 12/05/2015 14:35   Mr Angiogram Head Wo Contrast  Result Date: 12/06/2015 CLINICAL DATA:  Initial evaluation for acute difficulty moving right hand with numbness. EXAM: MRI HEAD WITHOUT CONTRAST MRA HEAD WITHOUT CONTRAST TECHNIQUE: Multiplanar, multiecho pulse sequences of the brain and surrounding structures were obtained without intravenous contrast. Angiographic images of the head were obtained using MRA technique without contrast. COMPARISON:  Prior CT from 03/21/2008. FINDINGS: MRI HEAD FINDINGS Mild diffuse prominence of the  CSF containing spaces is compatible with generalized cerebral atrophy. Patchy and confluent T2/FLAIR hyperintensity within the periventricular and deep white matter both cerebral hemispheres most consistent with chronic small vessel ischemic disease, fairly mild for patient age. Few small scatter remote infarcts present within the left cerebellar hemisphere. There is patchy  restricted diffusion involving the cortical gray matter of the left frontoparietal region, compatible with acute ischemic infarct (series 100, image 42). This involves the precentral gyrus/ motor strip. No associated hemorrhage or mass effect. No other evidence for acute ischemia. Gray-white matter differentiation otherwise maintained. Major intracranial vascular flow voids are preserved. No acute or chronic intracranial hemorrhage. No mass lesion, midline shift, or mass effect. No hydrocephalus. No extra-axial fluid collection. Major dural sinuses are grossly patent. Craniocervical junction within normal limits. Degenerative spondylolysis noted within the visualized upper cervical spine without significant stenosis. Pituitary gland normal. No acute abnormality about the globes and orbits. Patient status post lens extraction bilaterally. Mild scattered mucosal thickening within the paranasal sinuses. No air-fluid level to suggest active sinus infection. No mastoid effusion. Inner ear structures grossly normal. Bone marrow signal intensity within normal limits. No scalp soft tissue abnormality. MRA HEAD FINDINGS ANTERIOR CIRCULATION: Distal cervical segments of the internal carotid arteries are patent with antegrade flow. Petrous, cavernous, supraclinoid segments widely patent without flow-limiting stenosis. A1 segments patent. Anterior communicating artery normal. Anterior cerebral arteries well opacified to their distal aspects. M1 segments patent without stenosis or occlusion. MCA bifurcations normal. Distal MCA branches well opacified and symmetric. POSTERIOR CIRCULATION: Vertebral arteries patent to the vertebrobasilar junction. Right vertebral artery slightly dominant. Visualized posterior inferior cerebral arteries patent. Basilar artery well opacified to its distal aspect. Superior cerebral arteries patent bilaterally. Left posterior cerebral artery arises from the basilar artery and is well opacified to its  distal aspect. Fetal type right PCA supplied via a widely patent right posterior communicating artery. Right PCA also supplied to its distal aspect. No aneurysm or vascular malformation. IMPRESSION: MRI HEAD IMPRESSION: 1. 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 2. No other acute intracranial process identified. 3. Few scattered small remote left cerebellar infarcts. 4. Mild atrophy with chronic small vessel ischemic disease. MRA HEAD IMPRESSION: Negative intracranial MRA. No large or proximal anterior branch occlusion. No high-grade or correctable stenosis. Electronically Signed   By: Jeannine Boga M.D.   On: 12/06/2015 15:25   Mr Brain Wo Contrast  Result Date: 12/06/2015 CLINICAL DATA:  Initial evaluation for acute difficulty moving right hand with numbness. EXAM: MRI HEAD WITHOUT CONTRAST MRA HEAD WITHOUT CONTRAST TECHNIQUE: Multiplanar, multiecho pulse sequences of the brain and surrounding structures were obtained without intravenous contrast. Angiographic images of the head were obtained using MRA technique without contrast. COMPARISON:  Prior CT from 03/21/2008. FINDINGS: MRI HEAD FINDINGS Mild diffuse prominence of the CSF containing spaces is compatible with generalized cerebral atrophy. Patchy and confluent T2/FLAIR hyperintensity within the periventricular and deep white matter both cerebral hemispheres most consistent with chronic small vessel ischemic disease, fairly mild for patient age. Few small scatter remote infarcts present within the left cerebellar hemisphere. There is patchy restricted diffusion involving the cortical gray matter of the left frontoparietal region, compatible with acute ischemic infarct (series 100, image 42). This involves the precentral gyrus/ motor strip. No associated hemorrhage or mass effect. No other evidence for acute ischemia. Gray-white matter differentiation otherwise maintained. Major  intracranial vascular flow voids are preserved. No acute or chronic intracranial hemorrhage. No mass lesion, midline  shift, or mass effect. No hydrocephalus. No extra-axial fluid collection. Major dural sinuses are grossly patent. Craniocervical junction within normal limits. Degenerative spondylolysis noted within the visualized upper cervical spine without significant stenosis. Pituitary gland normal. No acute abnormality about the globes and orbits. Patient status post lens extraction bilaterally. Mild scattered mucosal thickening within the paranasal sinuses. No air-fluid level to suggest active sinus infection. No mastoid effusion. Inner ear structures grossly normal. Bone marrow signal intensity within normal limits. No scalp soft tissue abnormality. MRA HEAD FINDINGS ANTERIOR CIRCULATION: Distal cervical segments of the internal carotid arteries are patent with antegrade flow. Petrous, cavernous, supraclinoid segments widely patent without flow-limiting stenosis. A1 segments patent. Anterior communicating artery normal. Anterior cerebral arteries well opacified to their distal aspects. M1 segments patent without stenosis or occlusion. MCA bifurcations normal. Distal MCA branches well opacified and symmetric. POSTERIOR CIRCULATION: Vertebral arteries patent to the vertebrobasilar junction. Right vertebral artery slightly dominant. Visualized posterior inferior cerebral arteries patent. Basilar artery well opacified to its distal aspect. Superior cerebral arteries patent bilaterally. Left posterior cerebral artery arises from the basilar artery and is well opacified to its distal aspect. Fetal type right PCA supplied via a widely patent right posterior communicating artery. Right PCA also supplied to its distal aspect. No aneurysm or vascular malformation. IMPRESSION: MRI HEAD IMPRESSION: 1. 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 2. No other acute  intracranial process identified. 3. Few scattered small remote left cerebellar infarcts. 4. Mild atrophy with chronic small vessel ischemic disease. MRA HEAD IMPRESSION: Negative intracranial MRA. No large or proximal anterior branch occlusion. No high-grade or correctable stenosis. Electronically Signed   By: Jeannine Boga M.D.   On: 12/06/2015 15:25   Dg Hand Complete Right  Result Date: 12/05/2015 CLINICAL DATA:  Limited range of motion of the right fingers. No reported injury. EXAM: RIGHT HAND - COMPLETE 3+ VIEW COMPARISON:  None. FINDINGS: No dislocation or suspicious focal osseous lesion. Moderate osteoarthritis at the first carpometacarpal joint. Mild osteoarthritis at third metacarpophalangeal joint. Mild-to-moderate osteoarthritis at the distal interphalangeal joint of the right second finger. No evidence of erosive arthropathy. No radiopaque body. IMPRESSION: 1. No fracture or malalignment. 2. 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 Electronically Signed   By: Ilona Sorrel M.D.   On: 12/05/2015 12:40    EKG:   Normal sinus rhythm 66 bpm  IMPRESSION AND PLAN:   1. 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 Since the stroke affected his frontal lobe I am wondering whether this affected his mood because he was yelling at me and denying whether he had a stroke or not. Start aspirin 81 mg daily and start Lipitor and check a lipid profile. Get an echocardiogram, carotid ultrasound and monitor on telemetry. Get physical therapy, occupational therapy evaluations. Already saw Dr. Melrose Nakayama as outpatient. 2. GERD on PPI 3. Neck pain hold meloxicam at this point. 4. History of rheumatoid arthritis    All the records are reviewed and case discussed with ED provider. Management plans discussed with the patient, family and they are in agreement.  CODE STATUS: Full code  TOTAL TIME TAKING CARE OF THIS PATIENT: 60 minutes.     Loletha Grayer M.D on 12/06/2015 at 6:37 PM  Between 7am to 6pm - Pager - [phone]  After 6pm call admission pager [phone]  [facility]  [phone]  CC: Primary care physician; Tracie Harrier, MD

## 2015-12-06 NOTE — ED Notes (Addendum)
See triage note  Right arm numbness since yesterday denies any injury but was sent over by Dr Melrose Nakayama for MRI  Per family her woke up yesterday AM with theses sx's

## 2015-12-06 NOTE — ED Triage Notes (Signed)
Reports right arm numbness yesterday.  Seen here and dx with nerve issue, saw Dr Melrose Nakayama today and sent to ER for MRI

## 2015-12-06 NOTE — ED Notes (Signed)
Admitting MD at bedside.

## 2015-12-06 NOTE — ED Provider Notes (Addendum)
[facility] Emergency Department Provider Note  ____________________________________________   First MD Initiated Contact with Patient 12/06/15 1401     (approximate)  I have reviewed the triage vital signs and the nursing notes.   HISTORY  Chief Complaint Numbness    HPI [name] [name] is a 75 y.o. male presents to the emergency department at the request of Dr. Melrose Nakayama, neurology, so the patient can receive an emergent MRI of his brain and cervical spine.  He was seen in [facility] in the emergency department yesterday morning for new onset partial paralysis of his right hand.  He discovered the symptoms when he awoke in the morning.He went to an urgent care and then was referred to the emergency department.  He has no history of trauma or injury.  He was seen in the emergency department by Dr. Doy Mince the on-call neurologist and had a CT cervical spine that was reassuring except for multiple levels of degenerative spondylosis and osteophytes.  The recommendation was for outpatient follow-up with neurology.  He saw Dr. Melrose Nakayama in the office today and Dr. Melrose Nakayama is concerned about the possibility of a stroke versus a cervical spinal lesion that was not visible on the CT scan.  He called Carleene Overlie our charge nurse and told him that the patient was coming over.  After the patient arrived, my colleague Dr. Clearnce Hasten called by phone and spoke with Dr. Melrose Nakayama to clarify and verify the MR orders that he wanted.  Patient states that he feels like the partial paralysis is improving slightly.  He still cannot use his right hand well, however, and describes his symptoms as severe.  He has not had pain although he does have a history of neck pain.  As previously mentioned the onset of the symptoms was acute but was apparent when he awoke in the morning.     Past Medical History:  Diagnosis Date  . Arthritis   . Cataracts, bilateral   . Chickenpox   . Dyslipidemia   . GERD  (gastroesophageal reflux disease)   . Rheumatoid arthritis [facility])     Patient Active Problem List   Diagnosis Date Noted  . CVA (cerebral infarction) 12/06/2015    Past Surgical History:  Procedure Laterality Date  . CATARACT EXTRACTION Bilateral 2011; 2013  . COLONOSCOPY WITH PROPOFOL N/A 04/06/2015   Procedure: COLONOSCOPY WITH PROPOFOL;  Surgeon: Josefine Class, MD;  Location: [facility] ENDOSCOPY;  Service: Endoscopy;  Laterality: N/A;  . TONSILLECTOMY    . TRIGGER FINGER RELEASE Left 10/22/12   Thumb    Prior to Admission medications   Medication Sig Start Date End Date Taking? Authorizing Provider  meloxicam (MOBIC) 15 MG tablet Take 15 mg by mouth daily.    Historical Provider, MD  omeprazole (PRILOSEC) 20 MG capsule Take 20 mg by mouth as needed.     Historical Provider, MD  Probiotic Product (ALIGN) 4 MG CAPS Take 1 capsule by mouth daily.    Historical Provider, MD    Allergies Review of patient's allergies indicates no known allergies.  No family history on file.  Social History Social History  Substance Use Topics  . Smoking status: Former Smoker    Quit date: 10/25/1988  . Smokeless tobacco: Never Used  . Alcohol use No    Review of Systems Constitutional: No fever/chills Eyes: No visual changes. ENT: No sore throat. Cardiovascular: Denies chest pain. Respiratory: Denies shortness of breath. Gastrointestinal: No abdominal pain.  No nausea, no vomiting.  No  diarrhea.  No constipation. Genitourinary: Negative for dysuria. Musculoskeletal: Negative for back pain. Skin: Negative for rash. Neurological: Unable to extend the fingers of the right hand.  10-point ROS otherwise negative.  ____________________________________________   PHYSICAL EXAM:  VITAL SIGNS: ED Triage Vitals  Enc Vitals Group     BP 12/06/15 1241 125/75     Pulse Rate 12/06/15 1241 67     Resp 12/06/15 1241 18     Temp 12/06/15 1241 97.6 F (36.4 C)     Temp Source 12/06/15 1241  Oral     SpO2 12/06/15 1241 96 %     Weight 12/06/15 1221 180 lb (81.6 kg)     Height 12/06/15 1221 5\' 11"  (1.803 m)     Head Circumference --      Peak Flow --      Pain Score 12/06/15 1222 1     Pain Loc --      Pain Edu? --      Excl. in [place]? --     Constitutional: Alert and oriented. Well appearing and in no acute distress. Eyes: Conjunctivae are normal. PERRL. EOMI. Head: Atraumatic. Nose: No congestion/rhinnorhea. Mouth/Throat: Mucous membranes are moist.  Oropharynx non-erythematous. Neck: No stridor.  No meningeal signs.  No cervical spine tenderness to palpation. Cardiovascular: Normal rate, regular rhythm. Good peripheral circulation. Grossly normal heart sounds. Respiratory: Normal respiratory effort.  No retractions. Lungs CTAB. Gastrointestinal: Soft and nontender. No distention.  Musculoskeletal: No lower extremity tenderness nor edema. No gross deformities of extremities. Neurologic:  LUE strength and sensation are normal.  Unable to extend fingers, worse middle, ring, and pinky fingers.  Normal R wrist extension.  Normal RUE biceps/triceps. Skin:  Skin is warm, dry and intact. No rash noted. Psychiatric: Mood and affect are normal. Speech and behavior are normal.  ____________________________________________   LABS (all labs ordered are listed, but only abnormal results are displayed)  Labs Reviewed  COMPREHENSIVE METABOLIC PANEL - Abnormal; Notable for the following:       Result Value   BUN 26 (*)    GFR calc non Af Amer 58 (*)    All other components within normal limits  CBC WITH DIFFERENTIAL/PLATELET  ETHANOL  TROPONIN I  PROTIME-INR  URINE DRUG SCREEN, QUALITATIVE ([facility] ONLY)  URINALYSIS COMPLETEWITH MICROSCOPIC ([facility] ONLY)  LIPID PANEL  HEMOGLOBIN [id number]  BASIC METABOLIC PANEL  CBC  CBC  CREATININE, SERUM   ____________________________________________  EKG  ED ECG REPORT I, Phenix Grein, the attending physician, personally viewed and  interpreted this ECG.  Date: 12/06/2015 EKG Time: 16:20 Rate: 66 Rhythm: normal sinus rhythm QRS Axis: normal Intervals: normal ST/T Wave abnormalities: normal Conduction Disturbances: none Narrative Interpretation: unremarkable  ____________________________________________  RADIOLOGY   Mr Angiogram Head Wo Contrast  Result Date: 12/06/2015 CLINICAL DATA:  Initial evaluation for acute difficulty moving right hand with numbness. EXAM: MRI HEAD WITHOUT CONTRAST MRA HEAD WITHOUT CONTRAST TECHNIQUE: Multiplanar, multiecho pulse sequences of the brain and surrounding structures were obtained without intravenous contrast. Angiographic images of the head were obtained using MRA technique without contrast. COMPARISON:  Prior CT from 03/21/2008. FINDINGS: MRI HEAD FINDINGS Mild diffuse prominence of the CSF containing spaces is compatible with generalized cerebral atrophy. Patchy and confluent T2/FLAIR hyperintensity within the periventricular and deep white matter both cerebral hemispheres most consistent with chronic small vessel ischemic disease, fairly mild for patient age. Few small scatter remote infarcts present within the left cerebellar hemisphere. There is patchy restricted diffusion involving the cortical gray  matter of the left frontoparietal region, compatible with acute ischemic infarct (series 100, image 42). This involves the precentral gyrus/ motor strip. No associated hemorrhage or mass effect. No other evidence for acute ischemia. Gray-white matter differentiation otherwise maintained. Major intracranial vascular flow voids are preserved. No acute or chronic intracranial hemorrhage. No mass lesion, midline shift, or mass effect. No hydrocephalus. No extra-axial fluid collection. Major dural sinuses are grossly patent. Craniocervical junction within normal limits. Degenerative spondylolysis noted within the visualized upper cervical spine without significant stenosis. Pituitary gland  normal. No acute abnormality about the globes and orbits. Patient status post lens extraction bilaterally. Mild scattered mucosal thickening within the paranasal sinuses. No air-fluid level to suggest active sinus infection. No mastoid effusion. Inner ear structures grossly normal. Bone marrow signal intensity within normal limits. No scalp soft tissue abnormality. MRA HEAD FINDINGS ANTERIOR CIRCULATION: Distal cervical segments of the internal carotid arteries are patent with antegrade flow. Petrous, cavernous, supraclinoid segments widely patent without flow-limiting stenosis. A1 segments patent. Anterior communicating artery normal. Anterior cerebral arteries well opacified to their distal aspects. M1 segments patent without stenosis or occlusion. MCA bifurcations normal. Distal MCA branches well opacified and symmetric. POSTERIOR CIRCULATION: Vertebral arteries patent to the vertebrobasilar junction. Right vertebral artery slightly dominant. Visualized posterior inferior cerebral arteries patent. Basilar artery well opacified to its distal aspect. Superior cerebral arteries patent bilaterally. Left posterior cerebral artery arises from the basilar artery and is well opacified to its distal aspect. Fetal type right PCA supplied via a widely patent right posterior communicating artery. Right PCA also supplied to its distal aspect. No aneurysm or vascular malformation. IMPRESSION: MRI HEAD IMPRESSION: 1. 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 2. No other acute intracranial process identified. 3. Few scattered small remote left cerebellar infarcts. 4. Mild atrophy with chronic small vessel ischemic disease. MRA HEAD IMPRESSION: Negative intracranial MRA. No large or proximal anterior branch occlusion. No high-grade or correctable stenosis. Electronically Signed   By: Jeannine Boga M.D.   On: 12/06/2015 15:25   Mr Brain Wo Contrast  Result  Date: 12/06/2015 CLINICAL DATA:  Initial evaluation for acute difficulty moving right hand with numbness. EXAM: MRI HEAD WITHOUT CONTRAST MRA HEAD WITHOUT CONTRAST TECHNIQUE: Multiplanar, multiecho pulse sequences of the brain and surrounding structures were obtained without intravenous contrast. Angiographic images of the head were obtained using MRA technique without contrast. COMPARISON:  Prior CT from 03/21/2008. FINDINGS: MRI HEAD FINDINGS Mild diffuse prominence of the CSF containing spaces is compatible with generalized cerebral atrophy. Patchy and confluent T2/FLAIR hyperintensity within the periventricular and deep white matter both cerebral hemispheres most consistent with chronic small vessel ischemic disease, fairly mild for patient age. Few small scatter remote infarcts present within the left cerebellar hemisphere. There is patchy restricted diffusion involving the cortical gray matter of the left frontoparietal region, compatible with acute ischemic infarct (series 100, image 42). This involves the precentral gyrus/ motor strip. No associated hemorrhage or mass effect. No other evidence for acute ischemia. Gray-white matter differentiation otherwise maintained. Major intracranial vascular flow voids are preserved. No acute or chronic intracranial hemorrhage. No mass lesion, midline shift, or mass effect. No hydrocephalus. No extra-axial fluid collection. Major dural sinuses are grossly patent. Craniocervical junction within normal limits. Degenerative spondylolysis noted within the visualized upper cervical spine without significant stenosis. Pituitary gland normal. No acute abnormality about the globes and orbits. Patient status post lens extraction bilaterally. Mild scattered mucosal thickening within the paranasal sinuses.  No air-fluid level to suggest active sinus infection. No mastoid effusion. Inner ear structures grossly normal. Bone marrow signal intensity within normal limits. No scalp soft  tissue abnormality. MRA HEAD FINDINGS ANTERIOR CIRCULATION: Distal cervical segments of the internal carotid arteries are patent with antegrade flow. Petrous, cavernous, supraclinoid segments widely patent without flow-limiting stenosis. A1 segments patent. Anterior communicating artery normal. Anterior cerebral arteries well opacified to their distal aspects. M1 segments patent without stenosis or occlusion. MCA bifurcations normal. Distal MCA branches well opacified and symmetric. POSTERIOR CIRCULATION: Vertebral arteries patent to the vertebrobasilar junction. Right vertebral artery slightly dominant. Visualized posterior inferior cerebral arteries patent. Basilar artery well opacified to its distal aspect. Superior cerebral arteries patent bilaterally. Left posterior cerebral artery arises from the basilar artery and is well opacified to its distal aspect. Fetal type right PCA supplied via a widely patent right posterior communicating artery. Right PCA also supplied to its distal aspect. No aneurysm or vascular malformation. IMPRESSION: MRI HEAD IMPRESSION: 1. 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 2. No other acute intracranial process identified. 3. Few scattered small remote left cerebellar infarcts. 4. Mild atrophy with chronic small vessel ischemic disease. MRA HEAD IMPRESSION: Negative intracranial MRA. No large or proximal anterior branch occlusion. No high-grade or correctable stenosis. Electronically Signed   By: Jeannine Boga M.D.   On: 12/06/2015 15:25    ____________________________________________   PROCEDURES  Procedure(s) performed:   Procedures   Critical Care performed: No ____________________________________________   INITIAL IMPRESSION / ASSESSMENT AND PLAN / ED COURSE  Pertinent labs & imaging results that were available during my care of the patient were reviewed by me and considered in my medical  decision making (see chart for details).  NIH Stroke Scale  Interval: Baseline Time: 14:05 Person Administering Scale: Sakiyah Shur  Administer stroke scale items in the order listed. Record performance in each category after each subscale exam. Do not go back and change scores. Follow directions provided for each exam technique. Scores should reflect what the patient does, not what the clinician thinks the patient can do. The clinician should record answers while administering the exam and work quickly. Except where indicated, the patient should not be coached (i.e., repeated requests to patient to make a special effort).   1a  Level of consciousness: 0=alert; keenly responsive  1b. LOC questions:  0=Performs both tasks correctly  1c. LOC commands: 0=Performs both tasks correctly  2.  Best Gaze: 0=normal  3.  Visual: 0=No visual loss  4. Facial Palsy: 0=Normal symmetric movement  5a.  Motor left arm: 0=No drift, limb holds 90 (or 45) degrees for full 10 seconds  5b.  Motor right arm: 0=No drift, limb holds 90 (or 45) degrees for full 10 seconds  6a. motor left leg: 0=No drift, limb holds 90 (or 45) degrees for full 10 seconds  6b  Motor right leg:  0=No drift, limb holds 90 (or 45) degrees for full 10 seconds  7. Limb Ataxia: 0=Absent  8.  Sensory: 0=Normal; no sensory loss  9. Best Language:  0=No aphasia, normal  10. Dysarthria: 0=Normal  11. Extinction and Inattention: 0=No abnormality  12. Distal motor function: 1=At least some extension after 5 seconds, but is not fully extended. Any movement of the fingers which is not in response to a command is not scored   Total:   1   Patient is not a candidate for tPA given onset of symptoms (>24 hours ago)  and mild symptoms / lower NIHSS.  Proceeding with MR imaging as per Dr. Lannie Fields recommendations.  If indicative of CVA will admit, otherwise the patient will follow up as an outpatient.   Clinical Course  Value Comment By Time    The MR technologist called me and let me know that the brain and MR was abnormal and that the radiologist recommended that we canceled the cervical spine MR and instead obtain an MRA of the head without contrast completed the CVA workup.  I authorized them to proceed with that plan. Hinda Kehr, MD 09/07 1437  MR Brain Wo Contrast Results are consistent with an acute CVA.  I am ordering all the lab work needed for a stroke workup from the emergency department perspective and giving him a full dose aspirin.  No need to call a code stroke given that the symptoms started more than 24 hours ago and he has not within any sort of window for TPA and has relatively minimal symptoms.  I will admit him for full stroke evaluation and workup. Hinda Kehr, MD 09/07 1532   Updated patient and family.  Proceeding with plan for stroke admission. Hinda Kehr, MD 09/07 1542    ____________________________________________  FINAL CLINICAL IMPRESSION(S) / ED DIAGNOSES  Final diagnoses:  Acute CVA (cerebrovascular accident) ([place])     MEDICATIONS GIVEN DURING THIS VISIT:  Medications  atorvastatin (LIPITOR) tablet 40 mg (not administered)  aspirin EC tablet 81 mg (not administered)   stroke: mapping our early stages of recovery book (not administered)  enoxaparin (LOVENOX) injection 30 mg (not administered)  aspirin chewable tablet 324 mg (324 mg Oral Given 12/06/15 1610)     NEW OUTPATIENT MEDICATIONS STARTED DURING THIS VISIT:  New Prescriptions   No medications on file    Modified Medications   No medications on file    Discontinued Medications   No medications on file     Note:  This document was prepared using Dragon voice recognition software and may include unintentional dictation errors.    Hinda Kehr, MD 12/06/15 1820    Hinda Kehr, MD 12/06/15 Vernelle Emerald

## 2015-12-06 NOTE — ED Notes (Signed)
Patient unable to void at this time

## 2015-12-06 NOTE — Progress Notes (Signed)
Anticoagulation monitoring(Lovenox):  75 yo  male ordered Lovenox 30 mg Q24h  Filed Weights   12/06/15 1221 12/06/15 1241  Weight: 180 lb (81.6 kg) 179 lb (81.2 kg)   BMI    Lab Results  Component Value Date   CREATININE 1.19 12/06/2015   CREATININE 1.44 (H) 10/19/2013   Estimated Creatinine Clearance: 56.2 mL/min (by C-G formula based on SCr of 1.19 mg/dL). Hemoglobin & Hematocrit     Component Value Date/Time   HGB 14.8 12/06/2015 1555   HGB 16.4 10/19/2013 2051   HCT 42.4 12/06/2015 1555   HCT 49.3 10/19/2013 2051     Per Protocol for Patient with estCrcl > 30 ml/min and BMI < 40, will transition to Lovenox 40 mg Q24h.

## 2015-12-07 ENCOUNTER — Inpatient Hospital Stay: Payer: Medicare Other

## 2015-12-07 ENCOUNTER — Inpatient Hospital Stay
Admit: 2015-12-07 | Discharge: 2015-12-07 | Disposition: A | Discharge status: Home / Self Care | Payer: Medicare Other | Attending: Internal Medicine | Admitting: Internal Medicine

## 2015-12-07 DIAGNOSIS — I639 Cerebral infarction, unspecified: Principal | ICD-10-CM

## 2015-12-07 LAB — CBC
HCT: 40.4 % (ref 40.0–52.0)
Hemoglobin: 14 g/dL (ref 13.0–18.0)
MCH: 32.2 pg (ref 26.0–34.0)
MCHC: 34.7 g/dL (ref 32.0–36.0)
MCV: 92.8 fL (ref 80.0–100.0)
PLATELETS: 211 10*3/uL (ref 150–440)
RBC: 4.35 MIL/uL — AB (ref 4.40–5.90)
RDW: 13.2 % (ref 11.5–14.5)
WBC: 8.8 10*3/uL (ref 3.8–10.6)

## 2015-12-07 LAB — LIPID PANEL
CHOL/HDL RATIO: 8.2 ratio
Cholesterol: 214 mg/dL — ABNORMAL HIGH (ref 0–200)
HDL: 26 mg/dL — AB (ref >40)
LDL Cholesterol: 155 mg/dL — ABNORMAL HIGH (ref 0–99)
Triglycerides: 167 mg/dL — ABNORMAL HIGH (ref <150)
VLDL: 33 mg/dL (ref 0–40)

## 2015-12-07 LAB — BASIC METABOLIC PANEL
ANION GAP: 6 (ref 5–15)
BUN: 24 mg/dL — ABNORMAL HIGH (ref 6–20)
CALCIUM: 8.9 mg/dL (ref 8.9–10.3)
CO2: 24 mmol/L (ref 22–32)
CREATININE: 1.12 mg/dL (ref 0.61–1.24)
Chloride: 110 mmol/L (ref 101–111)
GFR calc Af Amer: >60 mL/min (ref >60)
Glucose, Bld: 95 mg/dL (ref 65–99)
Potassium: 3.9 mmol/L (ref 3.5–5.1)
Sodium: 140 mmol/L (ref 135–145)

## 2015-12-07 LAB — ECHOCARDIOGRAM COMPLETE
Height: 71 in
Weight: 2780.8 oz

## 2015-12-07 LAB — HEMOGLOBIN A1C: HEMOGLOBIN A1C: 5.3 % (ref 4.0–6.0)

## 2015-12-07 MED ORDER — ACETAMINOPHEN 325 MG PO TABS
650.0000 mg | ORAL_TABLET | Freq: Four times a day (QID) | ORAL | Status: DC | PRN
  Filled 2015-12-07: qty 2

## 2015-12-07 MED ORDER — ATORVASTATIN CALCIUM 40 MG PO TABS: 40.0000 mg | ORAL_TABLET | Freq: Every day | ORAL | 2 refills | Status: DC

## 2015-12-07 MED ORDER — ASPIRIN 81 MG PO TBEC: 81.0000 mg | DELAYED_RELEASE_TABLET | Freq: Every day | ORAL | 2 refills | Status: DC

## 2015-12-07 NOTE — Evaluation (Signed)
Physical Therapy Evaluation Patient Details Name: [name] [name] MRN: [id number] DOB: 06-11-1940 Today's Date: 12/07/2015   History of Present Illness   75 y/o male here with R hand weakness and coordiantion issues.  He is also having some word finding issues, though he is not as aware of this.  Pt shows great effort with PT exam and generally does well.   Clinical Impression  Pt is able to ambulate and participate with balance acts w/o any issue.  He did well with all mobility/PT acts but continues to have severe R hand weakness (expecially with finger/wrist extension).  He reports hand is feeling like it is moving more, and he made a good effort with trying to use it but ultimately is very limited with hand - otherwise he was able to do all he needed.     Follow Up Recommendations No PT follow up (will likely need outpatient OT, possibly speech)    Equipment Recommendations       Recommendations for Other Services Speech consult (per MD)     Precautions / Restrictions Precautions Precautions: None Restrictions Weight Bearing Restrictions: No      Mobility  Bed Mobility Overal bed mobility: Independent                Transfers Overall transfer level: Independent Equipment used: None             General transfer comment: Pt able to rise and get up to standing w/o assist and shows good effort and confidence  Ambulation/Gait Ambulation/Gait assistance: Independent Ambulation Distance (Feet): 250 Feet Assistive device: None       General Gait Details: Pt walks with good speed, confidence, safety.  He reports that he feels he is very close to his baseline in this regard.  Stairs Stairs: Yes Stairs assistance: Modified independent (Device/Increase time) Stair Management: No rails;Alternating pattern Number of Stairs: 6 General stair comments: Pt able to negotiate up/down steps w/o issue.  Shows good safety and confidence.   Wheelchair Mobility     Modified Rankin (Stroke Patients Only)       Balance Overall balance assessment: Modified Independent (able to do NBOS, eyes closed perturbations and SLS w/o LOBs)                                           Pertinent Vitals/Pain Pain Assessment: No/denies pain    Home Living Family/patient expects to be discharged to:: Private residence Living Arrangements: Alone Available Help at Discharge: Friend(s) Type of Home: House Home Access: Stairs to enter Entrance Stairs-Rails: None Entrance Stairs-Number of Steps: 2          Prior Function Level of Independence: Independent         Comments: Pt normally able to be very active and independent with all tasks     Hand Dominance   Dominant Hand: Right    Extremity/Trunk Assessment   Upper Extremity Assessment: Overall WFL for tasks assessed (except R hand, finger extension more limited than grip/wrist)           Lower Extremity Assessment: Overall WFL for tasks assessed         Communication   Communication:  (has some expressive/word finding limitations)  Cognition Arousal/Alertness: Awake/alert Behavior During Therapy: WFL for tasks assessed/performed Overall Cognitive Status: Within Functional Limits for tasks assessed  General Comments      Exercises        Assessment/Plan    PT Assessment Patent does not need any further PT services  PT Diagnosis Generalized weakness   PT Problem List    PT Treatment Interventions     PT Goals (Current goals can be found in the Care Plan section) Acute Rehab PT Goals Patient Stated Goal: Go home PT Goal Formulation: With patient    Frequency     Barriers to discharge        Co-evaluation               End of Session Equipment Utilized During Treatment: Gait belt Activity Tolerance: Patient tolerated treatment well Patient left: in chair;with call bell/phone within reach;with family/visitor  present           Time: [id number] PT Time Calculation (min) (ACUTE ONLY): 23 min   Charges:   PT Evaluation $PT Eval Low Complexity: 1 Procedure     PT G CodesKreg Shropshire, DPT 12/07/2015, 11:28 AM

## 2015-12-07 NOTE — Plan of Care (Signed)
MD making rounds. Received order to discharge home. IV removed. Removed telemetry. Prescriptions E-Scribed to pharmacy. Discharge paperwork provided, explained, signed and witnessed. No unanswered questions. Discharged via wheelchair by nursing staff. Belongings sent with patient and family.

## 2015-12-07 NOTE — Care Management Important Message (Signed)
Important Message  Patient Details  Name: Zackari Kristoff MRN: [id number] Date of Birth: 09-25-1940   Medicare Important Message Given:  Yes    Jolly Mango, RN 12/07/2015, 9:23 AM

## 2015-12-07 NOTE — Discharge Summary (Signed)
[facility] NAME: Anup Lorenz    MR#:  [id number]  DATE OF BIRTH:  02-14-1941  DATE OF ADMISSION:  12/06/2015   ADMITTING PHYSICIAN: No admitting provider for patient encounter.  DATE OF DISCHARGE: 12/07/2015  PRIMARY CARE PHYSICIAN: HANDE,VISHWANATH, MD   ADMISSION DIAGNOSIS:   Stroke ([place]) [I63.9] Acute CVA (cerebrovascular accident) ([place]) [I63.9]  DISCHARGE DIAGNOSIS:   Active Problems:   CVA (cerebral infarction)   SECONDARY DIAGNOSIS:   Past Medical History:  Diagnosis Date  . Arthritis   . Cataracts, bilateral   . Chickenpox   . Dyslipidemia   . GERD (gastroesophageal reflux disease)   . Rheumatoid arthritis [facility])     HOSPITAL COURSE:   75 year old male with hypertension, dyslipidemia, GERD presents to the hospital secondary to right hand weakness.  #1 acute CVA-MRI with acute posterior left frontal infarct affecting his right hand. -Ambulating well. Worked with physical therapy. Will need occupational therapy for the right hand fine motor function. -Was not taking any antiplatelet agents at home. Started on aspirin. LDL 155 and so starting on atorvastatin. - Therapy consulted. Outpatient follow-up. -MRA without any occlusions. Carotid Dopplers done and pending. Echo with no cardiac source of thrombus. -Appreciate neurology consult  #2 hypertension-no issues with hypertension. Blood pressure is well controlled. No need to start new medications.  #3 hyperlipidemia-started on statin.   Medically stable for discharge today  DISCHARGE CONDITIONS:   Stable  CONSULTS OBTAINED:   Treatment Team:  Alexis Goodell, MD Catarina Hartshorn, MD  DRUG ALLERGIES:   No Known Allergies DISCHARGE MEDICATIONS:     Medication List    TAKE these medications   ALIGN 4 MG Caps Take 1 capsule by mouth daily.   aspirin 81 MG EC tablet Take 1 tablet (81 mg total) by mouth daily.   atorvastatin 40 MG  tablet Commonly known as:  LIPITOR Take 1 tablet (40 mg total) by mouth daily at 6 PM.   meloxicam 15 MG tablet Commonly known as:  MOBIC Take 15 mg by mouth daily.   omeprazole 20 MG capsule Commonly known as:  PRILOSEC Take 20 mg by mouth as needed.        DISCHARGE INSTRUCTIONS:   1. PCP f/u in 1-2 weeks 2. Neurology follow up in 2-3 weeks  DIET:   Cardiac diet  ACTIVITY:   Activity as tolerated  OXYGEN:   Home Oxygen: No.  Oxygen Delivery: room air  DISCHARGE LOCATION:   home   If you experience worsening of your admission symptoms, develop shortness of breath, life threatening emergency, suicidal or homicidal thoughts you must seek medical attention immediately by calling 911 or calling your MD immediately  if symptoms less severe.  You Must read complete instructions/literature along with all the possible adverse reactions/side effects for all the Medicines you take and that have been prescribed to you. Take any new Medicines after you have completely understood and accpet all the possible adverse reactions/side effects.   Please note  You were cared for by a hospitalist during your hospital stay. If you have any questions about your discharge medications or the care you received while you were in the hospital after you are discharged, you can call the unit and asked to speak with the hospitalist on call if the hospitalist that took care of you is not available. Once you are discharged, your primary care physician will handle any further medical issues. Please note that NO REFILLS for  any discharge medications will be authorized once you are discharged, as it is imperative that you return to your primary care physician (or establish a relationship with a primary care physician if you do not have one) for your aftercare needs so that they can reassess your need for medications and monitor your lab values.    On the day of Discharge:  VITAL SIGNS:   Blood  pressure 122/65, pulse 73, temperature 98.1 F (36.7 C), temperature source Oral, resp. rate 16, height 5\' 11"  (1.803 m), weight 78.8 kg (173 lb 12.8 oz), SpO2 100 %.  PHYSICAL EXAMINATION:    GENERAL:  75 y.o.-year-old patient lying in the bed with no acute distress.  EYES: Pupils equal, round, reactive to light and accommodation. No scleral icterus. Extraocular muscles intact.  HEENT: Head atraumatic, normocephalic. Oropharynx and nasopharynx clear.  NECK:  Supple, no jugular venous distention. No thyroid enlargement, no tenderness.  LUNGS: Normal breath sounds bilaterally, no wheezing, rales,rhonchi or crepitation. No use of accessory muscles of respiration.  CARDIOVASCULAR: S1, S2 normal. No murmurs, rubs, or gallops.  ABDOMEN: Soft, non-tender, non-distended. Bowel sounds present. No organomegaly or mass.  EXTREMITIES: No pedal edema, cyanosis, or clubbing.  NEUROLOGIC: Cranial nerves II through XII are intact. Muscle strength 5/5 in all extremities except right hand weakness,. Sensation intact. Gait not checked.  PSYCHIATRIC: The patient is alert and oriented x 3.  SKIN: No obvious rash, lesion, or ulcer.   DATA REVIEW:   CBC  Recent Labs Lab 12/07/15 0544  WBC 8.8  HGB 14.0  HCT 40.4  PLT 211    Chemistries   Recent Labs Lab 12/06/15 1555 12/07/15 0544  NA 139 140  K 4.0 3.9  CL 108 110  CO2 25 24  GLUCOSE 96 95  BUN 26* 24*  CREATININE 1.19 1.12  CALCIUM 9.2 8.9  AST 29  --   ALT 17  --   ALKPHOS 75  --   BILITOT 1.2  --      Microbiology Results  No results found for this or any previous visit.  RADIOLOGY:  Mr Angiogram Head Wo Contrast  Result Date: 12/06/2015 CLINICAL DATA:  Initial evaluation for acute difficulty moving right hand with numbness. EXAM: MRI HEAD WITHOUT CONTRAST MRA HEAD WITHOUT CONTRAST TECHNIQUE: Multiplanar, multiecho pulse sequences of the brain and surrounding structures were obtained without intravenous contrast. Angiographic  images of the head were obtained using MRA technique without contrast. COMPARISON:  Prior CT from 03/21/2008. FINDINGS: MRI HEAD FINDINGS Mild diffuse prominence of the CSF containing spaces is compatible with generalized cerebral atrophy. Patchy and confluent T2/FLAIR hyperintensity within the periventricular and deep white matter both cerebral hemispheres most consistent with chronic small vessel ischemic disease, fairly mild for patient age. Few small scatter remote infarcts present within the left cerebellar hemisphere. There is patchy restricted diffusion involving the cortical gray matter of the left frontoparietal region, compatible with acute ischemic infarct (series 100, image 42). This involves the precentral gyrus/ motor strip. No associated hemorrhage or mass effect. No other evidence for acute ischemia. Gray-white matter differentiation otherwise maintained. Major intracranial vascular flow voids are preserved. No acute or chronic intracranial hemorrhage. No mass lesion, midline shift, or mass effect. No hydrocephalus. No extra-axial fluid collection. Major dural sinuses are grossly patent. Craniocervical junction within normal limits. Degenerative spondylolysis noted within the visualized upper cervical spine without significant stenosis. Pituitary gland normal. No acute abnormality about the globes and orbits. Patient status post lens extraction bilaterally. Mild scattered  mucosal thickening within the paranasal sinuses. No air-fluid level to suggest active sinus infection. No mastoid effusion. Inner ear structures grossly normal. Bone marrow signal intensity within normal limits. No scalp soft tissue abnormality. MRA HEAD FINDINGS ANTERIOR CIRCULATION: Distal cervical segments of the internal carotid arteries are patent with antegrade flow. Petrous, cavernous, supraclinoid segments widely patent without flow-limiting stenosis. A1 segments patent. Anterior communicating artery normal. Anterior  cerebral arteries well opacified to their distal aspects. M1 segments patent without stenosis or occlusion. MCA bifurcations normal. Distal MCA branches well opacified and symmetric. POSTERIOR CIRCULATION: Vertebral arteries patent to the vertebrobasilar junction. Right vertebral artery slightly dominant. Visualized posterior inferior cerebral arteries patent. Basilar artery well opacified to its distal aspect. Superior cerebral arteries patent bilaterally. Left posterior cerebral artery arises from the basilar artery and is well opacified to its distal aspect. Fetal type right PCA supplied via a widely patent right posterior communicating artery. Right PCA also supplied to its distal aspect. No aneurysm or vascular malformation. IMPRESSION: MRI HEAD IMPRESSION: 1. 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 2. No other acute intracranial process identified. 3. Few scattered small remote left cerebellar infarcts. 4. Mild atrophy with chronic small vessel ischemic disease. MRA HEAD IMPRESSION: Negative intracranial MRA. No large or proximal anterior branch occlusion. No high-grade or correctable stenosis. Electronically Signed   By: Jeannine Boga M.D.   On: 12/06/2015 15:25   Mr Brain Wo Contrast  Result Date: 12/06/2015 CLINICAL DATA:  Initial evaluation for acute difficulty moving right hand with numbness. EXAM: MRI HEAD WITHOUT CONTRAST MRA HEAD WITHOUT CONTRAST TECHNIQUE: Multiplanar, multiecho pulse sequences of the brain and surrounding structures were obtained without intravenous contrast. Angiographic images of the head were obtained using MRA technique without contrast. COMPARISON:  Prior CT from 03/21/2008. FINDINGS: MRI HEAD FINDINGS Mild diffuse prominence of the CSF containing spaces is compatible with generalized cerebral atrophy. Patchy and confluent T2/FLAIR hyperintensity within the periventricular and deep white matter both  cerebral hemispheres most consistent with chronic small vessel ischemic disease, fairly mild for patient age. Few small scatter remote infarcts present within the left cerebellar hemisphere. There is patchy restricted diffusion involving the cortical gray matter of the left frontoparietal region, compatible with acute ischemic infarct (series 100, image 42). This involves the precentral gyrus/ motor strip. No associated hemorrhage or mass effect. No other evidence for acute ischemia. Gray-white matter differentiation otherwise maintained. Major intracranial vascular flow voids are preserved. No acute or chronic intracranial hemorrhage. No mass lesion, midline shift, or mass effect. No hydrocephalus. No extra-axial fluid collection. Major dural sinuses are grossly patent. Craniocervical junction within normal limits. Degenerative spondylolysis noted within the visualized upper cervical spine without significant stenosis. Pituitary gland normal. No acute abnormality about the globes and orbits. Patient status post lens extraction bilaterally. Mild scattered mucosal thickening within the paranasal sinuses. No air-fluid level to suggest active sinus infection. No mastoid effusion. Inner ear structures grossly normal. Bone marrow signal intensity within normal limits. No scalp soft tissue abnormality. MRA HEAD FINDINGS ANTERIOR CIRCULATION: Distal cervical segments of the internal carotid arteries are patent with antegrade flow. Petrous, cavernous, supraclinoid segments widely patent without flow-limiting stenosis. A1 segments patent. Anterior communicating artery normal. Anterior cerebral arteries well opacified to their distal aspects. M1 segments patent without stenosis or occlusion. MCA bifurcations normal. Distal MCA branches well opacified and symmetric. POSTERIOR CIRCULATION: Vertebral arteries patent to the vertebrobasilar junction. Right vertebral artery slightly dominant. Visualized posterior inferior cerebral  arteries  patent. Basilar artery well opacified to its distal aspect. Superior cerebral arteries patent bilaterally. Left posterior cerebral artery arises from the basilar artery and is well opacified to its distal aspect. Fetal type right PCA supplied via a widely patent right posterior communicating artery. Right PCA also supplied to its distal aspect. No aneurysm or vascular malformation. IMPRESSION: MRI HEAD IMPRESSION: 1. 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 2. No other acute intracranial process identified. 3. Few scattered small remote left cerebellar infarcts. 4. Mild atrophy with chronic small vessel ischemic disease. MRA HEAD IMPRESSION: Negative intracranial MRA. No large or proximal anterior branch occlusion. No high-grade or correctable stenosis. Electronically Signed   By: Jeannine Boga M.D.   On: 12/06/2015 15:25     Management plans discussed with the patient, family and they are in agreement.  CODE STATUS:     Code Status Orders        Start     Ordered   12/06/15 1820  Full code  Continuous     12/06/15 1819    Code Status History    Date Active Date Inactive Code Status Order ID Comments User Context   12/06/2015  6:19 PM 12/07/2015 11:35 AM Full Code [id number]  Loletha Grayer, MD ED      TOTAL TIME TAKING CARE OF THIS PATIENT: 37 minutes.    Gladstone Lighter M.D on 12/07/2015 at 2:52 PM  Between 7am to 6pm - Pager - [phone]  After 6pm go to [email or web address] - Proofreader  [facility]  Office  [phone]  CC: Primary care physician; Tracie Harrier, MD   Note: This dictation was prepared with Dragon dictation along with smaller phrase technology. Any transcriptional errors that result from this process are unintentional.

## 2015-12-07 NOTE — Progress Notes (Signed)
  Echocardiogram 2D Echocardiogram has been performed.  Jennette Dubin 12/07/2015, 12:40 PM

## 2015-12-07 NOTE — Care Management (Signed)
No home health needs identified.

## 2015-12-07 NOTE — Evaluation (Signed)
Speech Language Pathology Evaluation Patient Details Name: [name] [name] MRN: [id number] DOB: May 08, 1940 Today's Date: 12/07/2015 Time: 1500-1600 SLP Time Calculation (min) (ACUTE ONLY): 60 min  Problem List:  Patient Active Problem List   Diagnosis Date Noted  . CVA (cerebral infarction) 12/06/2015   Past Medical History:  Past Medical History:  Diagnosis Date  . Arthritis   . Cataracts, bilateral   . Chickenpox   . Dyslipidemia   . GERD (gastroesophageal reflux disease)   . Rheumatoid arthritis [facility])    Past Surgical History:  Past Surgical History:  Procedure Laterality Date  . CATARACT EXTRACTION Bilateral 2011; 2013  . COLONOSCOPY WITH PROPOFOL N/A 04/06/2015   Procedure: COLONOSCOPY WITH PROPOFOL;  Surgeon: Josefine Class, MD;  [facility] ENDOSCOPY;  Service: Endoscopy;  Laterality: N/A;  . TONSILLECTOMY    . TRIGGER FINGER RELEASE Left 10/22/12   Thumb   HPI:  Pt is a 75 y.o. male. Patient was sent into the ER after a MRI of the brain showed a stroke. The patient's been having trouble moving his right hand since yesterday morning. He came to the ER yesterday and was thought to have a nerve palsy and was send home w/ referral to Dr. Melrose Nakayama who saw him today and ordered an MRI of the brain. The patient spent most the time yelling at MD saying that he did not think he had a stroke. At first he refused to get any further testing. After family arrived he changed his mind to get the testing done while he is here. Pt is awake/alert and quite talkative. Noted pragmatic deficits during conversation. Speech clear; pt easily distracted and required redirection to tasks. Right hand(dominent) is weak but able to use his Left hand. Of note, close friend present indicated pt has had a decline in Cognition and Language over the past ~2-3 years. These changes have been noted by other friends as well during conversations, contact.   Assessment / Plan / Recommendation Clinical  Impression  Pt appears to present w/ Cognitive-Linguisitic deficits most affecting his retrieval of information during conversation and executive function tasks. Pragmatic deficits appear to impact his interactions during conversation especially w/ topic maintenance and turn-taking. Pt exibits memory and attention deficits; pt required verbal cues frequently to return to task at hand. Due to the nature of the Cognitive deficits being ongoing for ~2-3 years per friend report(MD aware), recommend referral to Neurology for a full Neurological workup/assessment to more objectively assess the nature of the deficits. This would better guide any further Rehab services in their tx plan of care. At this time, recommend full, 24 hour supervision for safety of pt. MD made aware and agreed. NSG updated.     SLP Assessment  Patient does not need any further Speech Lanaguage Pathology Services (until seen and assessed by Neurology - Outpatient svcs)    Follow Up Recommendations   (TBD)    Frequency and Duration  TBD        SLP Evaluation Prior Functioning  Cognitive/Linguistic Baseline: Baseline deficits (per clsoe friend) Type of Home: House  Lives With: Alone Available Help at Discharge: Friend(s) (for transportation) Education: finished HS; Audiological scientist w/ a business degree; worked for the [employer] at [employer]. Vocation: Retired   Associate Professor  Overall Cognitive Status: History of cognitive impairments - at baseline (not immediately different from his baseline per report) Arousal/Alertness: Awake/alert Orientation Level: Oriented to person;Oriented to place;Oriented to time;Disoriented to situation Attention: Alternating (not sustained  and focused for the entire screening) Alternating Attention: Impaired Alternating Attention Impairment: Verbal complex;Functional complex Memory: Impaired (suspected) Memory Impairment: Retrieval deficit;Decreased recall of new  information Awareness: Impaired Problem Solving: Impaired (min) Problem Solving Impairment: Verbal complex;Functional complex Executive Function: Reasoning;Organizing;Decision Making Reasoning: Impaired Reasoning Impairment: Verbal complex;Functional complex Organizing: Impaired Organizing Impairment: Verbal complex;Functional complex Decision Making: Impaired Decision Making Impairment: Verbal complex;Functional complex Behaviors: Restless;Verbal agitation;Perseveration (tangential) Safety/Judgment: Impaired Comments: suspect; pt was unable to dial 911 as the emergency number on the phone provided when given an emergency scenario    Comprehension  Auditory Comprehension Overall Auditory Comprehension: Appears within functional limits for tasks assessed Yes/No Questions: Within Functional Limits Commands: Within Functional Limits Visual Recognition/Discrimination Discrimination: Within Function Limits Reading Comprehension Reading Status: Not tested    Expression Expression Primary Mode of Expression: Verbal Verbal Expression Overall Verbal Expression: Impaired at baseline Initiation: No impairment Automatic Speech: Name;Social Response;Counting;Day of week;Month of year Level of Generative/Spontaneous Verbalization: Conversation Repetition: Impaired (mild) Level of Impairment: Sentence level Naming: No impairment Pragmatics: Impairment Impairments: Topic appropriateness;Topic maintenance;Turn Taking Interfering Components: Attention;Premorbid deficit Effective Techniques: Phonemic cues;Semantic cues Written Expression Dominant Hand: Right (new weakness post CVA) Written Expression: Not tested   Oral / Motor  Oral Motor/Sensory Function Overall Oral Motor/Sensory Function: Within functional limits Motor Speech Overall Motor Speech: Appears within functional limits for tasks assessed Respiration: Within functional limits Phonation: Normal Resonance: Within functional  limits Articulation: Within functional limitis (grossly) Intelligibility: Intelligible Motor Planning: Witnin functional limits Motor Speech Errors: Not applicable   GO                   Orinda Kenner, MS, CCC-SLP  Watson,Katherine 12/07/2015, 4:16 PM

## 2015-12-07 NOTE — Evaluation (Signed)
Occupational Therapy Evaluation Patient Details Name: [name] [name] MRN: [id number] DOB: 05-03-1940 Today's Date: 12/07/2015    History of Present Illness  Pt. is a 75 y/o male who was admitted for a CVA with R hand weakness and coordination.   Clinical Impression   Pt. Is a 75 y.o. Male who was admitted for a CVA. Pt. Presents with dominant right hand weakness, incoordination, limited ROM for digit extension, and thumb abduction which hinder his ability to use his hand to complete ADL and IADL tasks. Pt. Could benefit from skilled OT services to work on improving RUE strength, ROM, coordination, neuromuscular re-ed, and provide pt. Education about positioing, and RUE functioning. Pt. Plans to return home. Pt. Could benefit from follow-up OT services at the next level of care. Pt. Would be a good candidate for Outpatient OT services.    Follow Up Recommendations  Outpatient OT    Equipment Recommendations       Recommendations for Other Services       Precautions / Restrictions Precautions Precautions: None Restrictions Weight Bearing Restrictions: Yes                         Transfers  Independent                                                                  ADL Overall ADL's : Needs assistance/impaired Eating/Feeding: Set up   Grooming: Set up           Upper Body Dressing : Set up   Lower Body Dressing: Moderate assistance               Functional mobility during ADLs: Independent General ADL Comments: Pt. has difficulty using his dominant right hand to complete ADL tasks. Pt. uses left hand for most ADL functioning.     Vision     Perception     Praxis      Pertinent Vitals/Pain Pain Assessment: No/denies pain     Hand Dominance Right   Extremity/Trunk Assessment Upper Extremity Assessment Upper Extremity Assessment: RUE deficits/detail (Right shoulder flexion, abduction 4/5, elbow flexion,  extension, forearm supination, pronation 4+/5,  Thumb abduction 2-/5, impaired gross digit extension. Intact sensation to light touch, intact proprioceptive awareness.)           Communication     Cognition Arousal/Alertness: Awake/alert Behavior During Therapy: WFL for tasks assessed/performed Overall Cognitive Status: Within Functional Limits for tasks assessed                   Pt. Education was provided about compensating proximally with his RUE, and positioning.  General Comments       Exercises       Shoulder Instructions      Home Living Family/patient expects to be discharged to:: Private residence Living Arrangements: Alone Available Help at Discharge: Friend(s) Type of Home: House Home Access: Stairs to enter [employer] of Steps: 2 Entrance Stairs-Rails: None Home Layout: One level     Bathroom Shower/Tub: Teacher, early years/pre: Standard Bathroom Accessibility: Yes   Home Equipment: None          Prior Functioning/Environment Level of Independence: Independent  OT Diagnosis: Generalized weakness   OT Problem List: Decreased strength;Decreased range of motion;Decreased coordination;Impaired UE functional use;Decreased knowledge of use of DME or AE   OT Treatment/Interventions: Self-care/ADL training;Therapeutic exercise;Neuromuscular education;Therapeutic activities    OT Goals(Current goals can be found in the care plan section)    OT Frequency: Min 1X/week   Barriers to D/C:            Co-evaluation              End of Session    Activity Tolerance: Patient tolerated treatment well Patient left: in chair;with call bell/phone within reach;with chair alarm set;with family/visitor present   Time: 1415-1450 OT Time Calculation (min): 35 min Charges:  OT General Charges $OT Visit: 1 Procedure OT Evaluation $OT Eval Moderate Complexity: 1 Procedure OT Treatments $Self Care/Home  Management : 8-22 mins G-Codes:    Harrel Carina, MS, OTR/L 12/07/2015, 3:20 PM

## 2015-12-07 NOTE — Care Management (Addendum)
CM consult for discharge planning. Admitted with acute CVA of left frontal lobe. PT, OT and neurology pending. CM met with patient and his wife at bedside, CM role explained.  Patient sitting up eating, pleasant and oriented.  CM to follow. Currently up walking with PT.

## 2015-12-07 NOTE — Consult Note (Signed)
Referring Physician: Tressia Miners    Chief Complaint: Right hand weakness  HPI: [name] [name] is an 75 y.o. male with a history of RA on no medications who reports that he was doing some work around the house on 9/6 and had no complaints.  Yesterday awakened and was unable to extend his hand.  There was no pain.  There was no swelling.  There are no sensory complaints.  Patient presented for evaluation on yesterday and exam was felt to be consistent for a peripheral etiology.  Returned on yesterday for MRI of the brain.  Patient reports neck and shoulder pain for some time  Date last known well: Date: 12/05/2015 Time last known well: Time: 22:00 tPA Given: No: Outside time window  Past Medical History:  Diagnosis Date  . Arthritis   . Cataracts, bilateral   . Chickenpox   . Dyslipidemia   . GERD (gastroesophageal reflux disease)   . Rheumatoid arthritis [facility])     Past Surgical History:  Procedure Laterality Date  . CATARACT EXTRACTION Bilateral 2011; 2013  . COLONOSCOPY WITH PROPOFOL N/A 04/06/2015   Procedure: COLONOSCOPY WITH PROPOFOL;  Surgeon: Josefine Class, MD;  Location: [facility] ENDOSCOPY;  Service: Endoscopy;  Laterality: N/A;  . TONSILLECTOMY    . TRIGGER FINGER RELEASE Left 10/22/12   Thumb    Family History  Problem Relation Age of Onset  . Stroke Mother    Social History:  reports that he quit smoking about 27 years ago. He has never used smokeless tobacco. He reports that he does not drink alcohol or use drugs.  Allergies: No Known Allergies  Medications:  I have reviewed the patient's current medications. Prior to Admission:  Prescriptions Prior to Admission  Medication Sig Dispense Refill Last Dose  . meloxicam (MOBIC) 15 MG tablet Take 15 mg by mouth daily.   Not Taking at Unknown time  . omeprazole (PRILOSEC) 20 MG capsule Take 20 mg by mouth as needed.    prn at prn  . Probiotic Product (ALIGN) 4 MG CAPS Take 1 capsule by mouth daily.   Not Taking at  Unknown time   Scheduled: . aspirin EC  81 mg Oral Daily  . atorvastatin  40 mg Oral q1800  . enoxaparin (LOVENOX) injection  40 mg Subcutaneous Q24H  . pantoprazole  40 mg Oral Daily    ROS: History obtained from the patient  General ROS: negative for - chills, fatigue, fever, night sweats, weight gain or weight loss Psychological ROS: negative for - behavioral disorder, hallucinations, memory difficulties, mood swings or suicidal ideation Ophthalmic ROS: negative for - blurry vision, double vision, eye pain or loss of vision ENT ROS: negative for - epistaxis, nasal discharge, oral lesions, sore throat, tinnitus or vertigo Allergy and Immunology ROS: negative for - hives or itchy/watery eyes Hematological and Lymphatic ROS: negative for - bleeding problems, bruising or swollen lymph nodes Endocrine ROS: negative for - galactorrhea, hair pattern changes, polydipsia/polyuria or temperature intolerance Respiratory ROS: negative for - cough, hemoptysis, shortness of breath or wheezing Cardiovascular ROS: negative for - chest pain, dyspnea on exertion, edema or irregular heartbeat Gastrointestinal ROS: negative for - abdominal pain, diarrhea, hematemesis, nausea/vomiting or stool incontinence Genito-Urinary ROS: negative for - dysuria, hematuria, incontinence or urinary frequency/urgency Musculoskeletal ROS: negative for - joint swelling or muscular weakness Neurological ROS: as noted in HPI Dermatological ROS: negative for rash and skin lesion changes  Physical Examination: Blood pressure 122/65, pulse 73, temperature 98.1 F (36.7 C), temperature source  Oral, resp. rate 16, height 5\' 11"  (1.803 m), weight 78.8 kg (173 lb 12.8 oz), SpO2 100 %.  Mental Status: Alert, oriented, thought content appropriate.  Speech fluent without evidence of aphasia.  Able to follow 3 step commands without difficulty. Cranial Nerves: II: Discs flat bilaterally; Visual fields grossly normal, pupils equal,  round, reactive to light and accommodation III,IV, VI: ptosis not present, extra-ocular motions intact bilaterally V,VII: smile symmetric, facial light touch sensation normal bilaterally VIII: hearing normal bilaterally IX,X: gag reflex present XI: bilateral shoulder shrug XII: midline tongue extension Motor: Left upper extremity with 5/5 strength.  Right upper extremity 5/5 deltoid, biceps, triceps, wrist extension, hand grip.  Patient unable to extend fingers with last three digits being the most affected.  Some limitation in ulnar deviation.  5/5 in the bilateral lower extremities. Sensory: Pinprick and light touch intact throughout, bilaterally Deep Tendon Reflexes: 2+ and symmetric throughout Cerebellar: Normal gait  Laboratory Studies:  Basic Metabolic Panel:  Recent Labs Lab 12/06/15 1555 12/07/15 0544  NA 139 140  K 4.0 3.9  CL 108 110  CO2 25 24  GLUCOSE 96 95  BUN 26* 24*  CREATININE 1.19 1.12  CALCIUM 9.2 8.9    Liver Function Tests:  Recent Labs Lab 12/06/15 1555  AST 29  ALT 17  ALKPHOS 75  BILITOT 1.2  PROT 7.1  ALBUMIN 4.4   No results for input(s): LIPASE, AMYLASE in the last 168 hours. No results for input(s): AMMONIA in the last 168 hours.  CBC:  Recent Labs Lab 12/06/15 1555 12/07/15 0544  WBC 8.2 8.8  NEUTROABS 5.4  --   HGB 14.8 14.0  HCT 42.4 40.4  MCV 93.3 92.8  PLT 224 211    Cardiac Enzymes:  Recent Labs Lab 12/06/15 1555  TROPONINI <0.03    BNP: Invalid input(s): POCBNP  CBG: No results for input(s): GLUCAP in the last 168 hours.  Microbiology: No results found for this or any previous visit.  Coagulation Studies:  Recent Labs  12/06/15 1555  LABPROT 13.4  INR 1.02    Urinalysis:  Recent Labs Lab 12/06/15 2056  COLORURINE YELLOW*  LABSPEC 1.013  PHURINE 5.0  GLUCOSEU NEGATIVE  HGBUR NEGATIVE  BILIRUBINUR NEGATIVE  KETONESUR TRACE*  PROTEINUR NEGATIVE  NITRITE NEGATIVE  LEUKOCYTESUR NEGATIVE     Lipid Panel:    Component Value Date/Time   CHOL 214 (H) 12/07/2015 0544   TRIG 167 (H) 12/07/2015 0544   HDL 26 (L) 12/07/2015 0544   CHOLHDL 8.2 12/07/2015 0544   VLDL 33 12/07/2015 0544   LDLCALC 155 (H) 12/07/2015 0544    HgbA1C: No results found for: HGBA1C  Urine Drug Screen:     Component Value Date/Time   LABOPIA NONE DETECTED 12/06/2015 2056   COCAINSCRNUR NONE DETECTED 12/06/2015 2056   LABBENZ NONE DETECTED 12/06/2015 2056   AMPHETMU NONE DETECTED 12/06/2015 2056   THCU NONE DETECTED 12/06/2015 2056   LABBARB NONE DETECTED 12/06/2015 2056    Alcohol Level:  Recent Labs Lab 12/06/15 [place] <5    Other results: EKG: normal sinus rhythm at 66 bpm.  Imaging: Ct Cervical Spine Wo Contrast  Result Date: 12/05/2015 CLINICAL DATA:  Chronic neck pain.  Numbness in the right hand. EXAM: CT CERVICAL SPINE WITHOUT CONTRAST TECHNIQUE: Multidetector CT imaging of the cervical spine was performed without intravenous contrast. Multiplanar CT image reconstructions were also generated. COMPARISON:  03/21/2008 FINDINGS: Alignment: Loss of normal cervical lordosis. Skull base and vertebrae: No fracture  or focal destructive lesion. Soft tissues and spinal canal: Carotid atherosclerosis. No other soft tissue finding. Disc levels:  C1-2:  Ordinary osteoarthritis.  No canal stenosis. C2-3:  Chronic ankylosis.  Wide patency of the canal and foramina. C3-4:  Chronic ankylosis.  Wide patency of the canal and foramina. C4-5: Near fusion. Complete loss of disc height. Mild osteophytic encroachment upon the canal and foramina. Some potential for irritation of the exiting C5 nerve roots. C5-6:  Chronic ankylosis.  Wide patency of the canal and foramina. C6-7: Chronic disc degeneration. Small endplate osteophytes. Mild foraminal narrowing. C7-T1: Bilateral facet osteoarthritis with 2 mm of anterolisthesis. No canal or foraminal stenosis. 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 Osteophytic encroachment upon the canal and foramina. Foraminal narrowing could possibly affect the C5 nerve roots. This has worsened since 2009. Advanced chronic degenerative spondylosis at C6-7. Mild foraminal narrowing by osteophytes without visible neural compression. This has worsened since 2009. 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 No compressive stenosis. This has worsened since 2009. 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 Electronically Signed   By: Nelson Chimes M.D.   On: 12/05/2015 14:35   Mr Angiogram Head Wo Contrast  Result Date: 12/06/2015 CLINICAL DATA:  Initial evaluation for acute difficulty moving right hand with numbness. EXAM: MRI HEAD WITHOUT CONTRAST MRA HEAD WITHOUT CONTRAST TECHNIQUE: Multiplanar, multiecho pulse sequences of the brain and surrounding structures were obtained without intravenous contrast. Angiographic images of the head were obtained using MRA technique without contrast. COMPARISON:  Prior CT from 03/21/2008. FINDINGS: MRI HEAD FINDINGS Mild diffuse prominence of the CSF containing spaces is compatible with generalized cerebral atrophy. Patchy and confluent T2/FLAIR hyperintensity within the periventricular and deep white matter both cerebral hemispheres most consistent with chronic small vessel ischemic disease, fairly mild for patient age. Few small scatter remote infarcts present within the left cerebellar hemisphere. There is patchy restricted diffusion involving the cortical gray matter of the left frontoparietal region, compatible with acute ischemic infarct (series 100, image 42). This involves the precentral gyrus/ motor strip. No associated hemorrhage or mass effect. No other evidence for acute ischemia. Gray-white matter differentiation otherwise maintained. Major intracranial vascular flow voids are preserved. No acute  or chronic intracranial hemorrhage. No mass lesion, midline shift, or mass effect. No hydrocephalus. No extra-axial fluid collection. Major dural sinuses are grossly patent. Craniocervical junction within normal limits. Degenerative spondylolysis noted within the visualized upper cervical spine without significant stenosis. Pituitary gland normal. No acute abnormality about the globes and orbits. Patient status post lens extraction bilaterally. Mild scattered mucosal thickening within the paranasal sinuses. No air-fluid level to suggest active sinus infection. No mastoid effusion. Inner ear structures grossly normal. Bone marrow signal intensity within normal limits. No scalp soft tissue abnormality. MRA HEAD FINDINGS ANTERIOR CIRCULATION: Distal cervical segments of the internal carotid arteries are patent with antegrade flow. Petrous, cavernous, supraclinoid segments widely patent without flow-limiting stenosis. A1 segments patent. Anterior communicating artery normal. Anterior cerebral arteries well opacified to their distal aspects. M1 segments patent without stenosis or occlusion. MCA bifurcations normal. Distal MCA branches well opacified and symmetric. POSTERIOR CIRCULATION: Vertebral arteries patent to the vertebrobasilar junction. Right vertebral artery slightly dominant. Visualized posterior inferior cerebral arteries patent. Basilar artery well opacified to its distal aspect. Superior cerebral arteries patent bilaterally. Left posterior cerebral artery arises from the basilar artery and is well opacified to its  distal aspect. Fetal type right PCA supplied via a widely patent right posterior communicating artery. Right PCA also supplied to its distal aspect. No aneurysm or vascular malformation. IMPRESSION: MRI HEAD IMPRESSION: 1. 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 2. No other acute intracranial process identified. 3. Few scattered small  remote left cerebellar infarcts. 4. Mild atrophy with chronic small vessel ischemic disease. MRA HEAD IMPRESSION: Negative intracranial MRA. No large or proximal anterior branch occlusion. No high-grade or correctable stenosis. Electronically Signed   By: Jeannine Boga M.D.   On: 12/06/2015 15:25   Mr Brain Wo Contrast  Result Date: 12/06/2015 CLINICAL DATA:  Initial evaluation for acute difficulty moving right hand with numbness. EXAM: MRI HEAD WITHOUT CONTRAST MRA HEAD WITHOUT CONTRAST TECHNIQUE: Multiplanar, multiecho pulse sequences of the brain and surrounding structures were obtained without intravenous contrast. Angiographic images of the head were obtained using MRA technique without contrast. COMPARISON:  Prior CT from 03/21/2008. FINDINGS: MRI HEAD FINDINGS Mild diffuse prominence of the CSF containing spaces is compatible with generalized cerebral atrophy. Patchy and confluent T2/FLAIR hyperintensity within the periventricular and deep white matter both cerebral hemispheres most consistent with chronic small vessel ischemic disease, fairly mild for patient age. Few small scatter remote infarcts present within the left cerebellar hemisphere. There is patchy restricted diffusion involving the cortical gray matter of the left frontoparietal region, compatible with acute ischemic infarct (series 100, image 42). This involves the precentral gyrus/ motor strip. No associated hemorrhage or mass effect. No other evidence for acute ischemia. Gray-white matter differentiation otherwise maintained. Major intracranial vascular flow voids are preserved. No acute or chronic intracranial hemorrhage. No mass lesion, midline shift, or mass effect. No hydrocephalus. No extra-axial fluid collection. Major dural sinuses are grossly patent. Craniocervical junction within normal limits. Degenerative spondylolysis noted within the visualized upper cervical spine without significant stenosis. Pituitary gland normal. No  acute abnormality about the globes and orbits. Patient status post lens extraction bilaterally. Mild scattered mucosal thickening within the paranasal sinuses. No air-fluid level to suggest active sinus infection. No mastoid effusion. Inner ear structures grossly normal. Bone marrow signal intensity within normal limits. No scalp soft tissue abnormality. MRA HEAD FINDINGS ANTERIOR CIRCULATION: Distal cervical segments of the internal carotid arteries are patent with antegrade flow. Petrous, cavernous, supraclinoid segments widely patent without flow-limiting stenosis. A1 segments patent. Anterior communicating artery normal. Anterior cerebral arteries well opacified to their distal aspects. M1 segments patent without stenosis or occlusion. MCA bifurcations normal. Distal MCA branches well opacified and symmetric. POSTERIOR CIRCULATION: Vertebral arteries patent to the vertebrobasilar junction. Right vertebral artery slightly dominant. Visualized posterior inferior cerebral arteries patent. Basilar artery well opacified to its distal aspect. Superior cerebral arteries patent bilaterally. Left posterior cerebral artery arises from the basilar artery and is well opacified to its distal aspect. Fetal type right PCA supplied via a widely patent right posterior communicating artery. Right PCA also supplied to its distal aspect. No aneurysm or vascular malformation. IMPRESSION: MRI HEAD IMPRESSION: 1. 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 2. No other acute intracranial process identified. 3. Few scattered small remote left cerebellar infarcts. 4. Mild atrophy with chronic small vessel ischemic disease. MRA HEAD IMPRESSION: Negative intracranial MRA. No large or proximal anterior branch occlusion. No high-grade or correctable stenosis. Electronically Signed   By: Jeannine Boga M.D.   On: 12/06/2015 15:25    Assessment: 75 y.o. male presenting with  right hand  weakness.  MRI of the brain personally reviewed and shows an acute left posterior frontal lobe infarct.  MRA was unremarkable.  Carotid dopplers pending.  Echocardiogram shows no cardiac source of emboli with an EF of 70%.  A1c pending, LDL 155.  Stroke Risk Factors - hyperlipidemia  Plan: 1. Statin for lipid management with target LDL<70 2. PT consult, OT consult, Speech consult 3. Carotid dopplers pending 4. Prophylactic therapy-Antiplatelet med: Aspirin - dose 325mg  daily 5. Telemetry monitoring 6. Frequent neuro checks 7. Continue follow up with neurology on an outpatient basis   Alexis Goodell, MD Neurology [phone] 12/07/2015, 1:53 PM

## 2016-10-13 ENCOUNTER — Other Ambulatory Visit: Payer: Self-pay | Admitting: Internal Medicine

## 2016-10-13 DIAGNOSIS — M545 Low back pain: Secondary | ICD-10-CM

## 2016-10-28 ENCOUNTER — Other Ambulatory Visit: Payer: Self-pay | Admitting: Internal Medicine

## 2016-10-28 ENCOUNTER — Ambulatory Visit: Payer: Medicare Other

## 2016-10-28 ENCOUNTER — Ambulatory Visit
Admission: RE | Admit: 2016-10-28 | Discharge: 2016-10-28 | Disposition: A | Discharge status: Home / Self Care | Payer: Medicare Other | Source: Ambulatory Visit | Attending: Internal Medicine | Admitting: Internal Medicine

## 2016-10-28 DIAGNOSIS — I714 Abdominal aortic aneurysm, without rupture: Secondary | ICD-10-CM | POA: Insufficient documentation

## 2016-10-28 DIAGNOSIS — M545 Low back pain: Secondary | ICD-10-CM | POA: Diagnosis present

## 2016-10-28 DIAGNOSIS — M4807 Spinal stenosis, lumbosacral region: Secondary | ICD-10-CM | POA: Insufficient documentation

## 2016-10-28 DIAGNOSIS — M5137 Other intervertebral disc degeneration, lumbosacral region: Secondary | ICD-10-CM | POA: Diagnosis not present

## 2016-10-28 DIAGNOSIS — M4316 Spondylolisthesis, lumbar region: Secondary | ICD-10-CM | POA: Insufficient documentation

## 2016-10-28 DIAGNOSIS — M48061 Spinal stenosis, lumbar region without neurogenic claudication: Secondary | ICD-10-CM | POA: Diagnosis not present

## 2016-10-28 DIAGNOSIS — M7138 Other bursal cyst, other site: Secondary | ICD-10-CM | POA: Diagnosis not present

## 2019-01-25 IMAGING — MR MR LUMBAR SPINE W/O CM
4 of 5 series · 24 of 48 positions shown · non-contrast
Comparison: CT Abdomen and Pelvis 01/10/2015

CLINICAL DATA: 76-year-old male with intermittent lumbar back pain
for 1 year. Low pain radiating to the buttock and sometimes the left
leg. No known injury. Little improvement with spinal injection.

EXAM:
MRI LUMBAR SPINE WITHOUT CONTRAST
TECHNIQUE: Multiplanar, multisequence MR imaging of the lumbar spine was
performed. No intravenous contrast was administered.

[Series 2: T2 · sagittal · 4.0mm · 0.81mm/px · 6 of 17 slices shown (1 of 2)]
[im 1/17]
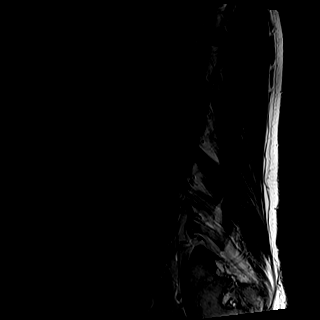
[im 4/17]
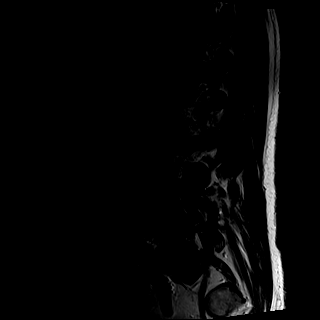
[im 7/17]
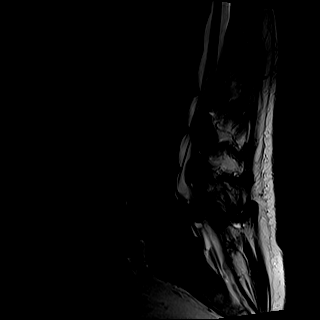
[im 10/17]
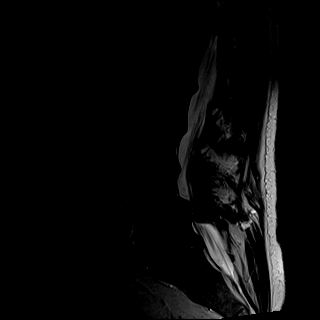
[im 13/17]
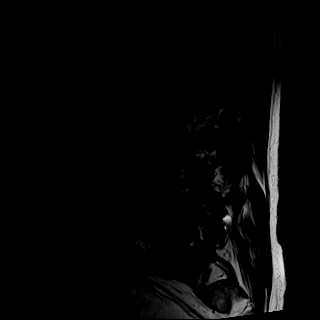
[im 17/17]
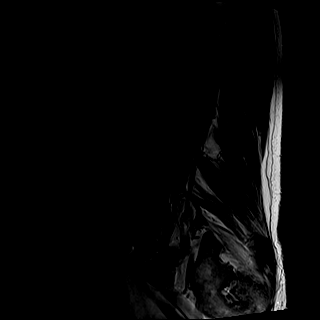

[Series 3: T1 · sagittal · 4.0mm · 0.41mm/px · 6 of 17 slices shown (1 of 2)]
[im 1/17]
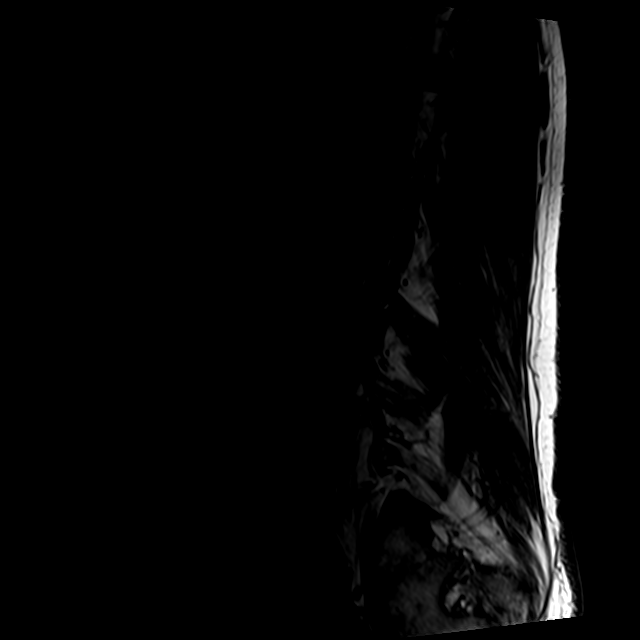
[im 4/17]
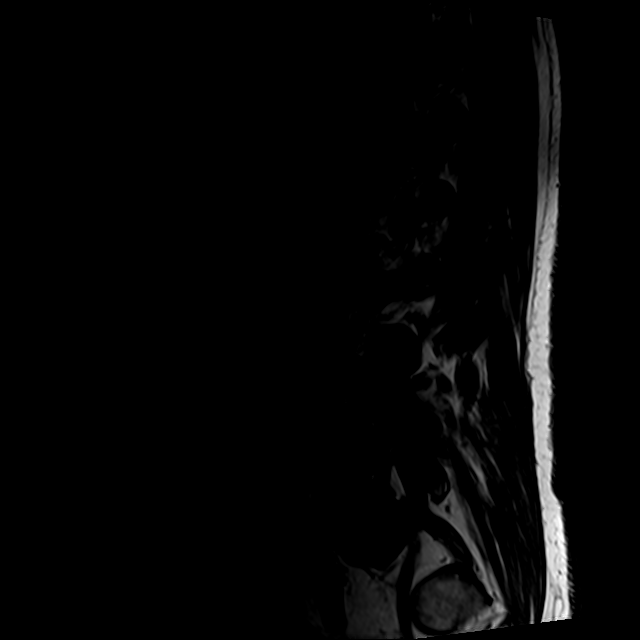
[im 7/17]
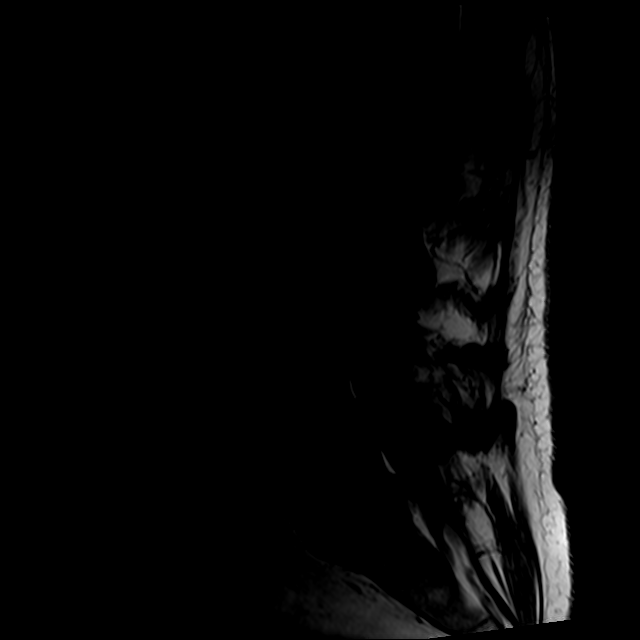
[im 10/17]
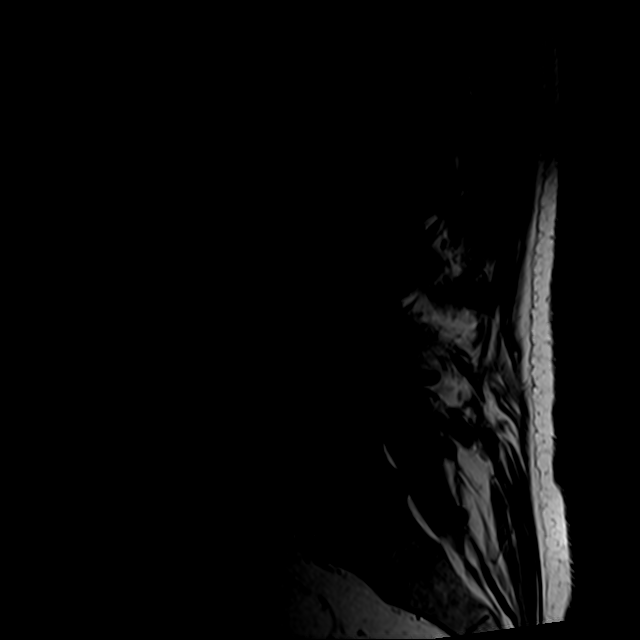
[im 13/17]
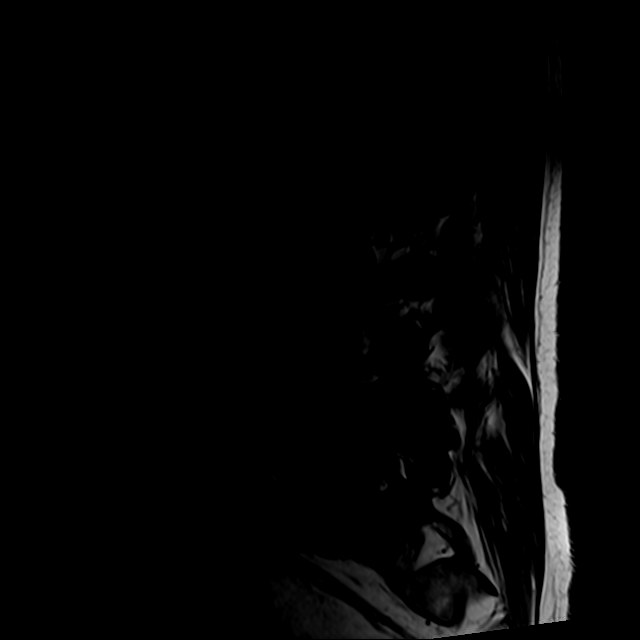
[im 17/17]
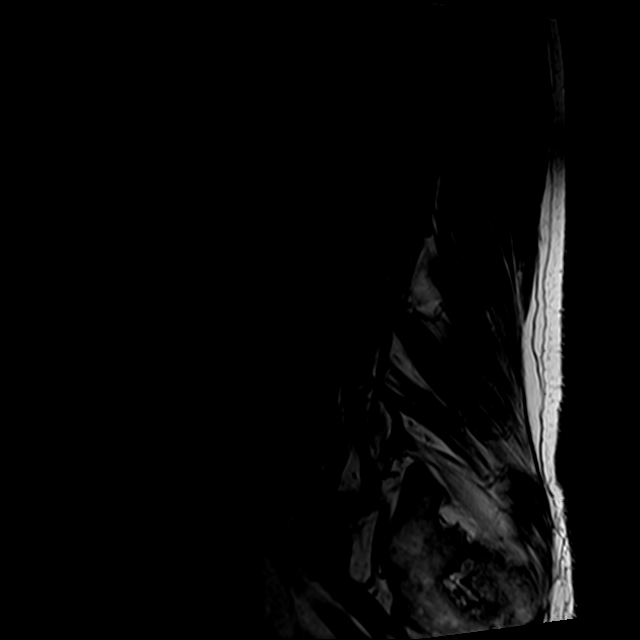

[Series 5: T2 · axial · 4.0mm · 0.78mm/px · z∈[-149,+91]mm · 9 of 39 slices shown (2 of 2)]
[im 1/39]
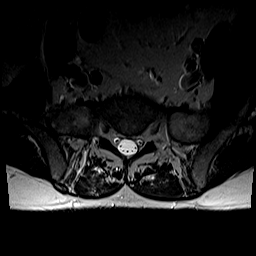
[im 6/39]
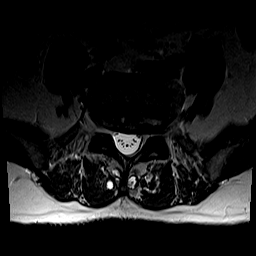
[im 11/39]
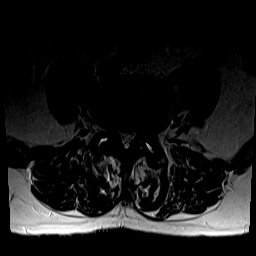
[im 17/39]
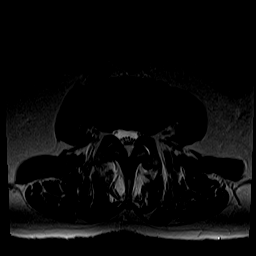
[im 20/39]
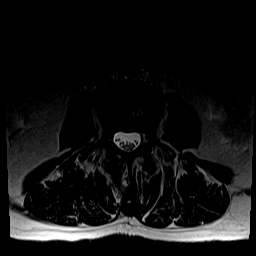
[im 22/39]
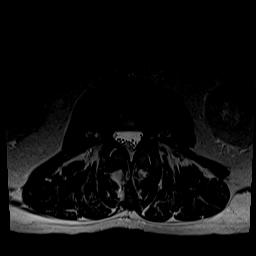
[im 28/39]
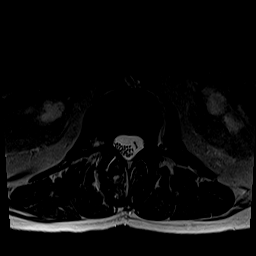
[im 33/39]
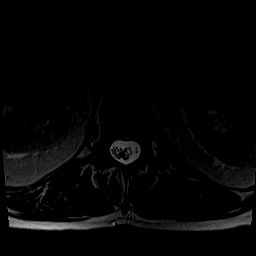
[im 39/39]
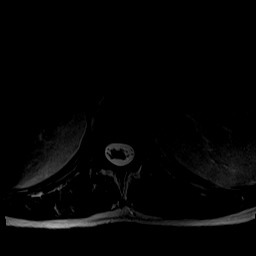

[Series 6: T1 · axial · 4.0mm · 0.39mm/px · z∈[-125,+62]mm · 3 of 39 slices shown (2 of 2)]
[im 6/39]
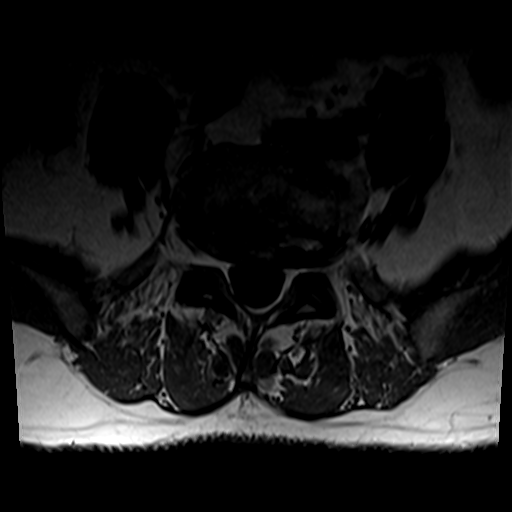
[im 20/39]
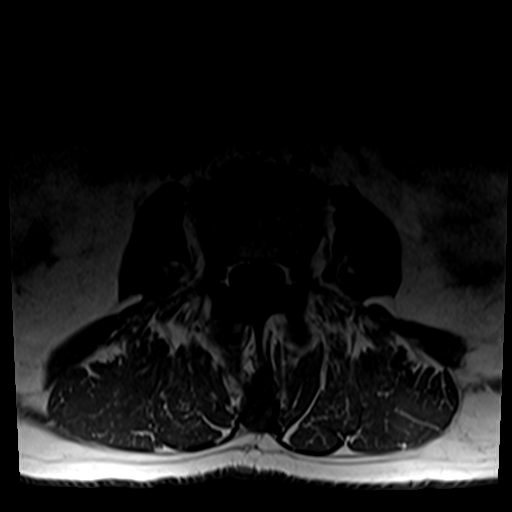
[im 33/39]
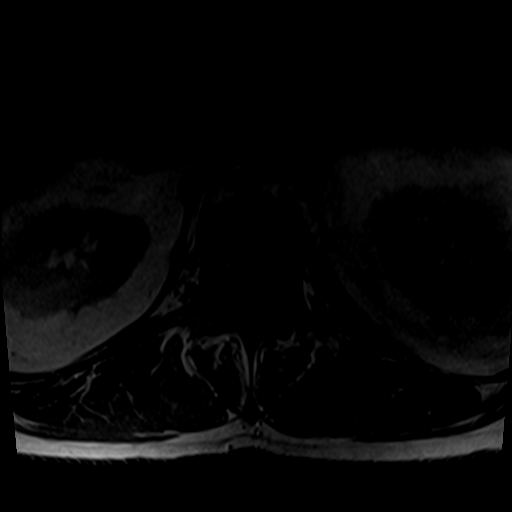

[24 of 48 positions shown; findings below may reference images not displayed]

FINDINGS: Segmentation: Comparison CT demonstrates normal lumbar segmentation
with vestigial S1-S2 disc space, but otherwise fully sacralized S1
level.

Alignment: Grade 1 anterolisthesis of L4 on L5 measuring 4-5 mm.
Mild retrolisthesis of L1 on L2 and L2 on L3. No significant change
in lumbar vertebral alignment since 3063.

Vertebrae: Chronic endplate Schmorl nodes at L2-L3 with mild to
moderate superimposed degenerative appearing marrow edema. Mild
degenerative appearing marrow edema at the inferior L4 endplate.
Trace degenerative marrow edema at the L1-L2 endplates, also with
small Schmorl nodes. No other No acute osseous abnormality
identified. Intact visible sacrum and SI joints.

Conus medullaris: Extends to the L1 level and appears normal.

Paraspinal and other soft tissues: Partially visible chronic
infrarenal abdominal aortic aneurysm, transverse diameter estimated
at 35 mm today, stable since 3063 but note the 41-42 mm AP diameter
at that time. Benign appearing renal parapelvic cysts. Otherwise
negative visualized abdominal viscera. Negative posterior paraspinal
soft tissues aside from numerous small degenerative synovial cysts,
including associated with the interspinous ligaments at L3-L4 and
L4-L5 (series 4, image 8).

Disc levels:

T11-T12:  Negative.

T12-L1:  Negative.

L1-L2: Severe disc space loss. Circumferential disc bulge and
endplate spurring eccentric to the right. Borderline to mild right
lateral recess stenosis (descending right L2 nerve root level).

L2-L3: Disc space loss. Circumferential but mostly far lateral disc
bulging and endplate spurring. Mild facet hypertrophy mostly on the
right. No significant stenosis.

L3-L4: Circumferential but mostly far lateral disc bulging and
endplate spurring. Broad-based posterior component. Mild facet and
ligament flavum hypertrophy. Trace facet joint fluid. Borderline to
mild spinal and bilateral L3 foraminal stenosis. Mild left lateral
recess stenosis (descending left L4 nerve root level).

L4-L5: Anterolisthesis. Disc desiccation. Circumferential disc bulge
eccentric to the left with broad-based posterior component. Severe
facet hypertrophy. Moderate ligament flavum hypertrophy. Facet joint
fluid. Multiple posteriorly situated synovial cysts. There is also a
superimposed 6-7 mm anteriorly directed synovial cyst on the right
(series 5, image 31). Moderate to severe spinal stenosis. Severe
bilateral lateral recess stenosis (descending L5 nerve root levels).
Severe left and mild to moderate right L4 foraminal stenosis.

L5-S1: Severe disc space loss. Circumferential but mostly far
lateral disc osteophyte complex. Evidence of interbody ankylosis. No
spinal or lateral recess stenosis. Mild to moderate left and mild
right L5 foraminal stenosis.
IMPRESSION: 1. Grade 1 anterolisthesis at L4-L5 with disc and severe posterior
element degeneration, including probable Baastrup disease.
Subsequent multifactorial moderate to severe spinal stenosis, and
severe bilateral lateral recess stenosis - including in part due to
a synovial cyst on the right side. Severe left and up to moderate
right L4 foraminal stenosis.
2. Chronic disc and endplate degeneration at L5-S1 with suspected
interbody ankylosis. Up to moderate left and mild right L5 foraminal
stenosis.
3. L3-L4 degeneration with up to mild spinal, left lateral recess,
and bilateral L3 foraminal stenosis.
4. Chronic infrarenal Abdominal Aortic Aneurysm. Recommend followup
by Ultrasound in 1 year.
This recommendation follows ACR consensus guidelines: White Paper of
the ACR Incidental Findings Committee II on Vascular Findings. [HOSPITAL] [id number]; [DATE].

## 2019-08-10 ENCOUNTER — Other Ambulatory Visit: Payer: Self-pay | Admitting: Internal Medicine

## 2020-04-11 ENCOUNTER — Other Ambulatory Visit: Payer: Self-pay | Admitting: Infectious Diseases

## 2020-07-26 ENCOUNTER — Other Ambulatory Visit: Payer: Self-pay

## 2020-07-26 MED FILL — Omeprazole Cap Delayed Release 20 MG: ORAL | 30 days supply | Qty: 30 | Fill #0 | Status: CN

## 2020-07-26 MED FILL — Omeprazole Cap Delayed Release 20 MG: ORAL | 30 days supply | Qty: 30 | Fill #0 | Status: AC

## 2020-08-15 ENCOUNTER — Other Ambulatory Visit: Payer: Self-pay

## 2020-11-01 ENCOUNTER — Other Ambulatory Visit: Payer: Self-pay

## 2020-11-02 ENCOUNTER — Other Ambulatory Visit: Payer: Self-pay

## 2020-11-02 MED ORDER — OMEPRAZOLE 20 MG PO CPDR
20.0000 mg | DELAYED_RELEASE_CAPSULE | Freq: Every day | ORAL | 5 refills | Status: DC
  Filled 2020-11-02: qty 30, 30d supply, fill #0
  Filled 2021-02-12: qty 30, 30d supply, fill #1
  Filled 2021-06-06: qty 30, 30d supply, fill #2

## 2020-11-06 ENCOUNTER — Other Ambulatory Visit: Payer: Self-pay

## 2020-12-19 ENCOUNTER — Other Ambulatory Visit: Payer: Self-pay

## 2020-12-19 MED ORDER — SERTRALINE HCL 25 MG PO TABS
25.0000 mg | ORAL_TABLET | Freq: Every day | ORAL | 1 refills | Status: DC
  Filled 2020-12-19: qty 90, 90d supply, fill #0

## 2020-12-28 ENCOUNTER — Other Ambulatory Visit: Payer: Self-pay

## 2021-02-12 ENCOUNTER — Other Ambulatory Visit: Payer: Self-pay

## 2021-02-14 ENCOUNTER — Other Ambulatory Visit: Payer: Self-pay

## 2021-02-26 ENCOUNTER — Other Ambulatory Visit: Payer: Self-pay

## 2021-02-26 MED ORDER — DOXYCYCLINE HYCLATE 100 MG PO TABS
ORAL_TABLET | ORAL | 0 refills | Status: DC
  Filled 2021-02-26: qty 20, 10d supply, fill #0

## 2021-05-30 ENCOUNTER — Other Ambulatory Visit: Payer: Self-pay

## 2021-05-30 MED ORDER — DILTIAZEM HCL ER COATED BEADS 120 MG PO CP24
ORAL_CAPSULE | ORAL | 11 refills | Status: DC
  Filled 2021-05-30: qty 30, 30d supply, fill #0

## 2021-05-30 MED ORDER — ELIQUIS 2.5 MG PO TABS
ORAL_TABLET | ORAL | 5 refills | Status: DC
  Filled 2021-05-30: qty 60, 30d supply, fill #0

## 2021-06-05 ENCOUNTER — Other Ambulatory Visit: Payer: Self-pay | Admitting: Internal Medicine

## 2021-06-05 DIAGNOSIS — I7143 Infrarenal abdominal aortic aneurysm, without rupture: Secondary | ICD-10-CM

## 2021-06-06 ENCOUNTER — Other Ambulatory Visit: Payer: Self-pay

## 2021-06-10 ENCOUNTER — Other Ambulatory Visit: Payer: Self-pay

## 2021-10-15 ENCOUNTER — Other Ambulatory Visit: Payer: Self-pay

## 2021-10-15 MED ORDER — VITAMIN B-12 1000 MCG PO TABS: ORAL_TABLET | ORAL | 5 refills | Status: DC

## 2022-03-01 ENCOUNTER — Emergency Department (HOSPITAL_COMMUNITY): Payer: Medicare Other

## 2022-03-01 ENCOUNTER — Observation Stay (HOSPITAL_COMMUNITY): Payer: Medicare Other

## 2022-03-01 ENCOUNTER — Inpatient Hospital Stay (HOSPITAL_COMMUNITY)
Admission: EM | Admit: 2022-03-01 | Discharge: 2022-03-06 | DRG: 064 | Disposition: A | Discharge status: Skilled Nursing Facility | Payer: Medicare Other | Attending: Internal Medicine | Admitting: Internal Medicine

## 2022-03-01 DIAGNOSIS — E538 Deficiency of other specified B group vitamins: Secondary | ICD-10-CM | POA: Diagnosis present

## 2022-03-01 DIAGNOSIS — Z87440 Personal history of urinary (tract) infections: Secondary | ICD-10-CM

## 2022-03-01 DIAGNOSIS — G8194 Hemiplegia, unspecified affecting left nondominant side: Secondary | ICD-10-CM | POA: Diagnosis present

## 2022-03-01 DIAGNOSIS — E785 Hyperlipidemia, unspecified: Secondary | ICD-10-CM | POA: Diagnosis present

## 2022-03-01 DIAGNOSIS — F01A0 Vascular dementia, mild, without behavioral disturbance, psychotic disturbance, mood disturbance, and anxiety: Secondary | ICD-10-CM | POA: Diagnosis present

## 2022-03-01 DIAGNOSIS — R29718 NIHSS score 18: Secondary | ICD-10-CM | POA: Diagnosis present

## 2022-03-01 DIAGNOSIS — R4701 Aphasia: Secondary | ICD-10-CM | POA: Diagnosis present

## 2022-03-01 DIAGNOSIS — T796XXA Traumatic ischemia of muscle, initial encounter: Secondary | ICD-10-CM

## 2022-03-01 DIAGNOSIS — Z981 Arthrodesis status: Secondary | ICD-10-CM

## 2022-03-01 DIAGNOSIS — Z79899 Other long term (current) drug therapy: Secondary | ICD-10-CM

## 2022-03-01 DIAGNOSIS — I2510 Atherosclerotic heart disease of native coronary artery without angina pectoris: Secondary | ICD-10-CM | POA: Diagnosis present

## 2022-03-01 DIAGNOSIS — R54 Age-related physical debility: Secondary | ICD-10-CM | POA: Diagnosis present

## 2022-03-01 DIAGNOSIS — M47816 Spondylosis without myelopathy or radiculopathy, lumbar region: Secondary | ICD-10-CM | POA: Diagnosis present

## 2022-03-01 DIAGNOSIS — G9341 Metabolic encephalopathy: Secondary | ICD-10-CM | POA: Diagnosis not present

## 2022-03-01 DIAGNOSIS — N179 Acute kidney failure, unspecified: Secondary | ICD-10-CM | POA: Diagnosis present

## 2022-03-01 DIAGNOSIS — K2211 Ulcer of esophagus with bleeding: Secondary | ICD-10-CM | POA: Diagnosis present

## 2022-03-01 DIAGNOSIS — I63311 Cerebral infarction due to thrombosis of right middle cerebral artery: Secondary | ICD-10-CM

## 2022-03-01 DIAGNOSIS — K2100 Gastro-esophageal reflux disease with esophagitis, without bleeding: Secondary | ICD-10-CM | POA: Diagnosis present

## 2022-03-01 DIAGNOSIS — G9349 Other encephalopathy: Secondary | ICD-10-CM | POA: Diagnosis not present

## 2022-03-01 DIAGNOSIS — E861 Hypovolemia: Secondary | ICD-10-CM | POA: Diagnosis present

## 2022-03-01 DIAGNOSIS — M069 Rheumatoid arthritis, unspecified: Secondary | ICD-10-CM | POA: Diagnosis present

## 2022-03-01 DIAGNOSIS — K449 Diaphragmatic hernia without obstruction or gangrene: Secondary | ICD-10-CM | POA: Diagnosis present

## 2022-03-01 DIAGNOSIS — R609 Edema, unspecified: Secondary | ICD-10-CM | POA: Diagnosis present

## 2022-03-01 DIAGNOSIS — Z7982 Long term (current) use of aspirin: Secondary | ICD-10-CM

## 2022-03-01 DIAGNOSIS — Z66 Do not resuscitate: Secondary | ICD-10-CM | POA: Diagnosis present

## 2022-03-01 DIAGNOSIS — Z823 Family history of stroke: Secondary | ICD-10-CM

## 2022-03-01 DIAGNOSIS — I4891 Unspecified atrial fibrillation: Secondary | ICD-10-CM | POA: Diagnosis not present

## 2022-03-01 DIAGNOSIS — H109 Unspecified conjunctivitis: Secondary | ICD-10-CM | POA: Diagnosis not present

## 2022-03-01 DIAGNOSIS — Z808 Family history of malignant neoplasm of other organs or systems: Secondary | ICD-10-CM

## 2022-03-01 DIAGNOSIS — R414 Neurologic neglect syndrome: Secondary | ICD-10-CM | POA: Diagnosis present

## 2022-03-01 DIAGNOSIS — R000 Tachycardia, unspecified: Secondary | ICD-10-CM | POA: Diagnosis present

## 2022-03-01 DIAGNOSIS — M25512 Pain in left shoulder: Secondary | ICD-10-CM | POA: Diagnosis present

## 2022-03-01 DIAGNOSIS — W19XXXA Unspecified fall, initial encounter: Principal | ICD-10-CM | POA: Diagnosis present

## 2022-03-01 DIAGNOSIS — K5730 Diverticulosis of large intestine without perforation or abscess without bleeding: Secondary | ICD-10-CM | POA: Diagnosis present

## 2022-03-01 DIAGNOSIS — G309 Alzheimer's disease, unspecified: Secondary | ICD-10-CM | POA: Diagnosis present

## 2022-03-01 DIAGNOSIS — Z515 Encounter for palliative care: Secondary | ICD-10-CM

## 2022-03-01 DIAGNOSIS — Z87891 Personal history of nicotine dependence: Secondary | ICD-10-CM

## 2022-03-01 DIAGNOSIS — K2210 Ulcer of esophagus without bleeding: Secondary | ICD-10-CM

## 2022-03-01 DIAGNOSIS — M6282 Rhabdomyolysis: Secondary | ICD-10-CM | POA: Diagnosis present

## 2022-03-01 DIAGNOSIS — R509 Fever, unspecified: Secondary | ICD-10-CM | POA: Diagnosis not present

## 2022-03-01 DIAGNOSIS — Z8673 Personal history of transient ischemic attack (TIA), and cerebral infarction without residual deficits: Secondary | ICD-10-CM

## 2022-03-01 DIAGNOSIS — I63511 Cerebral infarction due to unspecified occlusion or stenosis of right middle cerebral artery: Secondary | ICD-10-CM | POA: Diagnosis not present

## 2022-03-01 DIAGNOSIS — F02A0 Dementia in other diseases classified elsewhere, mild, without behavioral disturbance, psychotic disturbance, mood disturbance, and anxiety: Secondary | ICD-10-CM | POA: Diagnosis present

## 2022-03-01 DIAGNOSIS — I639 Cerebral infarction, unspecified: Secondary | ICD-10-CM | POA: Diagnosis present

## 2022-03-01 DIAGNOSIS — Z8249 Family history of ischemic heart disease and other diseases of the circulatory system: Secondary | ICD-10-CM

## 2022-03-01 DIAGNOSIS — M1990 Unspecified osteoarthritis, unspecified site: Secondary | ICD-10-CM | POA: Diagnosis present

## 2022-03-01 DIAGNOSIS — W1830XA Fall on same level, unspecified, initial encounter: Secondary | ICD-10-CM | POA: Diagnosis present

## 2022-03-01 DIAGNOSIS — S5012XA Contusion of left forearm, initial encounter: Secondary | ICD-10-CM

## 2022-03-01 DIAGNOSIS — D649 Anemia, unspecified: Secondary | ICD-10-CM | POA: Diagnosis present

## 2022-03-01 DIAGNOSIS — Z6822 Body mass index (BMI) 22.0-22.9, adult: Secondary | ICD-10-CM

## 2022-03-01 DIAGNOSIS — R233 Spontaneous ecchymoses: Secondary | ICD-10-CM | POA: Diagnosis not present

## 2022-03-01 DIAGNOSIS — Y92009 Unspecified place in unspecified non-institutional (private) residence as the place of occurrence of the external cause: Secondary | ICD-10-CM

## 2022-03-01 DIAGNOSIS — Z602 Problems related to living alone: Secondary | ICD-10-CM | POA: Diagnosis present

## 2022-03-01 DIAGNOSIS — I7143 Infrarenal abdominal aortic aneurysm, without rupture: Secondary | ICD-10-CM | POA: Diagnosis present

## 2022-03-01 DIAGNOSIS — K920 Hematemesis: Secondary | ICD-10-CM

## 2022-03-01 DIAGNOSIS — S0012XA Contusion of left eyelid and periocular area, initial encounter: Secondary | ICD-10-CM | POA: Diagnosis present

## 2022-03-01 DIAGNOSIS — Z91199 Patient's noncompliance with other medical treatment and regimen due to unspecified reason: Secondary | ICD-10-CM

## 2022-03-01 LAB — COMPREHENSIVE METABOLIC PANEL
ALT: 21 U/L (ref 0–44)
AST: 74 U/L — ABNORMAL HIGH (ref 15–41)
Albumin: 4.1 g/dL (ref 3.5–5.0)
Alkaline Phosphatase: 74 U/L (ref 38–126)
Anion gap: 13 (ref 5–15)
BUN: 28 mg/dL — ABNORMAL HIGH (ref 8–23)
CO2: 19 mmol/L — ABNORMAL LOW (ref 22–32)
Calcium: 9.7 mg/dL (ref 8.9–10.3)
Chloride: 106 mmol/L (ref 98–111)
Creatinine, Ser: 1.62 mg/dL — ABNORMAL HIGH (ref 0.61–1.24)
GFR, Estimated: 42 mL/min — ABNORMAL LOW (ref >60)
Glucose, Bld: 151 mg/dL — ABNORMAL HIGH (ref 70–99)
Potassium: 4.2 mmol/L (ref 3.5–5.1)
Sodium: 138 mmol/L (ref 135–145)
Total Bilirubin: 0.9 mg/dL (ref 0.3–1.2)
Total Protein: 6.7 g/dL (ref 6.5–8.1)

## 2022-03-01 LAB — CBC WITH DIFFERENTIAL/PLATELET
Abs Immature Granulocytes: 0.22 10*3/uL — ABNORMAL HIGH (ref 0.00–0.07)
Basophils Absolute: 0 10*3/uL (ref 0.0–0.1)
Basophils Relative: 0 %
Eosinophils Absolute: 0 10*3/uL (ref 0.0–0.5)
Eosinophils Relative: 0 %
HCT: 38.6 % — ABNORMAL LOW (ref 39.0–52.0)
Hemoglobin: 13 g/dL (ref 13.0–17.0)
Immature Granulocytes: 1 %
Lymphocytes Relative: 2 %
Lymphs Abs: 0.6 10*3/uL — ABNORMAL LOW (ref 0.7–4.0)
MCH: 32.2 pg (ref 26.0–34.0)
MCHC: 33.7 g/dL (ref 30.0–36.0)
MCV: 95.5 fL (ref 80.0–100.0)
Monocytes Absolute: 1.1 10*3/uL — ABNORMAL HIGH (ref 0.1–1.0)
Monocytes Relative: 4 %
Neutro Abs: 22.2 10*3/uL — ABNORMAL HIGH (ref 1.7–7.7)
Neutrophils Relative %: 93 %
Platelets: 290 10*3/uL (ref 150–400)
RBC: 4.04 MIL/uL — ABNORMAL LOW (ref 4.22–5.81)
RDW: 13.8 % (ref 11.5–15.5)
WBC: 24.1 10*3/uL — ABNORMAL HIGH (ref 4.0–10.5)
nRBC: 0 % (ref 0.0–0.2)

## 2022-03-01 LAB — I-STAT CHEM 8, ED
BUN: 28 mg/dL — ABNORMAL HIGH (ref 8–23)
Calcium, Ion: 1.07 mmol/L — ABNORMAL LOW (ref 1.15–1.40)
Chloride: 106 mmol/L (ref 98–111)
Creatinine, Ser: 1.4 mg/dL — ABNORMAL HIGH (ref 0.61–1.24)
Glucose, Bld: 144 mg/dL — ABNORMAL HIGH (ref 70–99)
HCT: 40 % (ref 39.0–52.0)
Hemoglobin: 13.6 g/dL (ref 13.0–17.0)
Potassium: 4.3 mmol/L (ref 3.5–5.1)
Sodium: 139 mmol/L (ref 135–145)
TCO2: 19 mmol/L — ABNORMAL LOW (ref 22–32)

## 2022-03-01 LAB — URINALYSIS, ROUTINE W REFLEX MICROSCOPIC
Bilirubin Urine: NEGATIVE
Glucose, UA: NEGATIVE mg/dL
Ketones, ur: NEGATIVE mg/dL
Leukocytes,Ua: NEGATIVE
Nitrite: NEGATIVE
Protein, ur: 30 mg/dL — AB
Specific Gravity, Urine: 1.041 — ABNORMAL HIGH (ref 1.005–1.030)
pH: 5 (ref 5.0–8.0)

## 2022-03-01 LAB — LIPID PANEL
Cholesterol: 169 mg/dL (ref 0–200)
HDL: 37 mg/dL — ABNORMAL LOW (ref >40)
LDL Cholesterol: 119 mg/dL — ABNORMAL HIGH (ref 0–99)
Total CHOL/HDL Ratio: 4.6 RATIO
Triglycerides: 67 mg/dL (ref <150)
VLDL: 13 mg/dL (ref 0–40)

## 2022-03-01 LAB — RAPID URINE DRUG SCREEN, HOSP PERFORMED
Amphetamines: NOT DETECTED
Barbiturates: NOT DETECTED
Benzodiazepines: NOT DETECTED
Cocaine: NOT DETECTED
Opiates: NOT DETECTED
Tetrahydrocannabinol: NOT DETECTED

## 2022-03-01 LAB — TYPE AND SCREEN
ABO/RH(D): O POS
Antibody Screen: NEGATIVE

## 2022-03-01 LAB — CK: Total CK: 3449 U/L — ABNORMAL HIGH (ref 49–397)

## 2022-03-01 LAB — ETHANOL: Alcohol, Ethyl (B): <10 mg/dL (ref <10)

## 2022-03-01 MED ORDER — DILTIAZEM HCL-DEXTROSE 125-5 MG/125ML-% IV SOLN (PREMIX)
5.0000 mg/h | INTRAVENOUS | Status: DC
  Administered 2022-03-01: 15 mg/h via INTRAVENOUS
  Administered 2022-03-01: 5 mg/h via INTRAVENOUS
  Administered 2022-03-02 (×2): 15 mg/h via INTRAVENOUS
  Filled 2022-03-01 (×4): qty 125

## 2022-03-01 MED ORDER — LACTATED RINGERS IV SOLN: INTRAVENOUS | Status: DC

## 2022-03-01 MED ORDER — LORAZEPAM 2 MG/ML IJ SOLN
1.0000 mg | Freq: Once | INTRAMUSCULAR | Status: AC | PRN
  Administered 2022-03-01: 1 mg via INTRAVENOUS
  Filled 2022-03-01: qty 1

## 2022-03-01 MED ORDER — ASPIRIN 300 MG RE SUPP
300.0000 mg | Freq: Once | RECTAL | Status: AC
  Administered 2022-03-01: 300 mg via RECTAL
  Filled 2022-03-01 (×2): qty 1

## 2022-03-01 MED ORDER — HEPARIN SODIUM (PORCINE) 5000 UNIT/ML IJ SOLN
5000.0000 [IU] | Freq: Three times a day (TID) | INTRAMUSCULAR | Status: DC
  Administered 2022-03-01 – 2022-03-02 (×2): 5000 [IU] via SUBCUTANEOUS
  Filled 2022-03-01 (×2): qty 1

## 2022-03-01 MED ORDER — SODIUM CHLORIDE 0.9 % IV SOLN: INTRAVENOUS | Status: DC

## 2022-03-01 MED ORDER — LACTATED RINGERS IV BOLUS
500.0000 mL | Freq: Once | INTRAVENOUS | Status: AC
  Administered 2022-03-01: 500 mL via INTRAVENOUS

## 2022-03-01 MED ORDER — IOHEXOL 350 MG/ML SOLN
75.0000 mL | Freq: Once | INTRAVENOUS | Status: AC | PRN
  Administered 2022-03-01: 75 mL via INTRAVENOUS

## 2022-03-01 NOTE — Consult Note (Signed)
[facility] Surgery Consult Note  [place] [birth year]  [id number].    Requesting MD: Davonna Belling Chief Complaint/Reason for Consult: fall  HPI:  [name] [name] is a 81 y.o. male PMH A fib possibly on eliquis (prior note in the chart that he had been refusing medications), HLD, GERD, and mild dementia who was brought to [facility] via EMS as a level 1 trauma after suffering a ground level fall. Per report patient lives at home alone, family has cameras in his house to check in on him. They saw footage where he fell around midnight landing on his left side. When they got to him he was unresponsive and they called EMS. Upon presentation in the ED patient seems confused but is verbalizing. Initially did not move the left arm, but then did move all extremities. Complaining of some left shoulder pain.  Tachycardic and in a fib, BP ok.  Chest and pelvic xrays negative. Patient taken to the scanner.   Family History  Problem Relation Age of Onset   Stroke Mother     Past Medical History:  Diagnosis Date   Arthritis    Cataracts, bilateral    Chickenpox    Dyslipidemia    GERD (gastroesophageal reflux disease)    Rheumatoid arthritis ([place])     Past Surgical History:  Procedure Laterality Date   CATARACT EXTRACTION Bilateral 2011; 2013   COLONOSCOPY WITH PROPOFOL N/A 04/06/2015   Procedure: COLONOSCOPY WITH PROPOFOL;  Surgeon: Josefine Class, MD;  Location: [facility] ENDOSCOPY;  Service: Endoscopy;  Laterality: N/A;   TONSILLECTOMY     TRIGGER FINGER RELEASE Left 10/22/12   Thumb    Social History:  reports that he quit smoking about 33 years ago. He has never used smokeless tobacco. He reports that he does not drink alcohol and does not use drugs.  Allergies: No Known Allergies  (Not in a hospital admission)   Prior to Admission medications   Medication Sig Start Date End Date Taking? Authorizing Provider  apixaban (ELIQUIS) 2.5 MG TABS tablet Take 1 tablet (2.5  mg total) by mouth every 12 (twelve) hours 05/30/21     aspirin EC 81 MG EC tablet Take 1 tablet (81 mg total) by mouth daily. 12/07/15   Gladstone Lighter, MD  atorvastatin (LIPITOR) 40 MG tablet Take 1 tablet (40 mg total) by mouth daily at 6 PM. 12/07/15   Gladstone Lighter, MD  diltiazem (CARDIZEM CD) 120 MG 24 hr capsule Take 1 capsule (120 mg total) by mouth once daily 05/30/21     doxycycline (VIBRA-TABS) 100 MG tablet Take 1 tablet by mouth two times daily for 10 days 02/26/21     meloxicam (MOBIC) 15 MG tablet Take 15 mg by mouth daily.    [provider]  omeprazole (PRILOSEC) 20 MG capsule Take 20 mg by mouth as needed.     [provider]  omeprazole (PRILOSEC) 20 MG capsule TAKE 1 CAPSULE BY MOUTH ONCE DAILY 08/10/19 08/25/20  Tracie Harrier, MD  omeprazole (PRILOSEC) 20 MG capsule TAKE 1 CAPSULE BY MOUTH ONCE DAILY 11/02/20     Probiotic Product (ALIGN) 4 MG CAPS Take 1 capsule by mouth daily.    [provider]  sertraline (ZOLOFT) 25 MG tablet Take 1 tablet (25 mg total) by mouth once daily 12/19/20     vitamin B-12 (CYANOCOBALAMIN) 1000 MCG tablet Take 1 tablet (1,000 mcg total) by mouth once daily 10/15/21       Blood pressure (!) 118/100, pulse Marland Kitchen)  131, SpO2 97 %. Physical Exam: General: elderly male in NAD HEENT: Sclera are noninjected.  Pupils equal and round and reactive to light. Mild left periorbital edema.  Ears and nose without any masses or lesions.  Mouth is dry. Dentition fair Heart: irregularly irregular, HR 140s. Palpable radial and pedal pulses bilaterally  Lungs: CTAB, no wheezes, rhonchi, or rales noted.  Respiratory effort nonlabored on room air Abd: soft, NT/ND, +BS, no masses, hernias, or organomegaly MS: skin tear and ecchymosis noted to the left forearm. Some edema and tenderness to the distal left clavicle. Lower extremities without signs of external trauma, no edema Skin: warm and dry with no masses, lesions, or rashes Psych: Alert,  oriented to self, follows most commands and intermittently answers questions Neuro: MAEs but limited movement in the LUE  Results for orders placed or performed during the hospital encounter of 03/01/22 (from the past 48 hour(s))  CBC with Differential     Status: Abnormal   Collection Time: 03/01/22 11:35 AM  Result Value Ref Range   WBC 24.1 (H) 4.0 - 10.5 K/uL   RBC 4.04 (L) 4.22 - 5.81 MIL/uL   Hemoglobin 13.0 13.0 - 17.0 g/dL   HCT 38.6 (L) 39.0 - 52.0 %   MCV 95.5 80.0 - 100.0 fL   MCH 32.2 26.0 - 34.0 pg   MCHC 33.7 30.0 - 36.0 g/dL   RDW 13.8 11.5 - 15.5 %   Platelets 290 150 - 400 K/uL   nRBC 0.0 0.0 - 0.2 %   Neutrophils Relative % 93 %   Neutro Abs 22.2 (H) 1.7 - 7.7 K/uL   Lymphocytes Relative 2 %   Lymphs Abs 0.6 (L) 0.7 - 4.0 K/uL   Monocytes Relative 4 %   Monocytes Absolute 1.1 (H) 0.1 - 1.0 K/uL   Eosinophils Relative 0 %   Eosinophils Absolute 0.0 0.0 - 0.5 K/uL   Basophils Relative 0 %   Basophils Absolute 0.0 0.0 - 0.1 K/uL   Immature Granulocytes 1 %   Abs Immature Granulocytes 0.22 (H) 0.00 - 0.07 K/uL    Comment: Performed at [facility], [address], [place], [place] [address]   No results found.    Assessment/Plan Fall LUE pain - xrays negative for fracture Cervical edema - continue collar, MRI A fib possibly on eliquis (prior note in the chart that he had been refusing medications) HLD GERD Mild dementia - MRI pending  ID - no issues VTE - hold anticoagulation after trauma, SCDs FEN - NPO Foley - none  Dispo - admit to medicine for fall, trauma on consult  I reviewed last 24 h vitals and pain scores, last 48 h intake and output, last 24 h labs and trends, and last 24 h imaging results.   Gurney Maxin, M.D. [facility] 03/01/2022, 11:54 AM Please see Amion for pager number during day hours 7:00am-4:30pm

## 2022-03-01 NOTE — Progress Notes (Signed)
Orthopedic Tech Progress Note Patient Details:  [name] [name] 1941/02/25 [id number] Level 1 Trauma  Patient ID: [name], male   DOB: 14-Apr-1940, 81 y.o.   MRN: [id number]  Jearld Lesch 03/01/2022, 11:49 AM

## 2022-03-01 NOTE — ED Notes (Signed)
Cervical collar applied

## 2022-03-01 NOTE — H&P (Cosign Needed Addendum)
Date: 03/01/2022               Patient Name:  [name] [name] MRN: [id number]  DOB: 1940-07-30 Age / Sex: 81 y.o., male   PCP: Tracie Harrier, MD         Medical Service: Internal Medicine Teaching Service         Attending Physician: Dr. Campbell Riches, MD      First Contact: Dr. Angelique Blonder, DO Pager [phone]    Second Contact: Dr. Sanjuana Letters, DO Pager [phone]         After Hours (After 5p/  First Contact Pager: [phone]  weekends / holidays): Second Contact Pager: [phone]   SUBJECTIVE   Chief Complaint: fall  History of Present Illness:  [name] [name] is a 81 y.o. male with PMH of prior CVA, mixed Alzheimer's and vascular dementia, Afib not on anticoagulation presents after fall last night. Son was present at bedside and provided most of the information. Son states patient lives alone but has cameras and sensors in place. Cameras noted fall overnight and patient was unable to get up. Patient states he felt dizzy and tried to hold onto counter but fell to the floor. Patient denies any pain after fall. No headache, n/v or dizziness. Bruising and abrasion noted for the left arm. Bruising around left eye and left hip.   Family has noted gradual decline for months. Able to hold conversation but has confusion at baseline. Patient does not take any medications except for tylenol as needed. He does not want to take prescribed medications. Son states he is working on finding a possible nursing facility given continued decline. Patient has been resistant to moving from his house.    ED Course: Brought in by EMS. Patient in Afib with RVR. Afebrile and hemodynamically stable. No acute fractures found on left arm or hip imaging. No cervical fractures noted on CT. MRI brain did show acute right MCA infarct.    Past Medical History -CVA -Afib not on anticoagulation -mixed Alzheimer's and vascular dementia -vitamin B12 deficiency   Past Surgical History Past Surgical  History:  Procedure Laterality Date   CATARACT EXTRACTION Bilateral 2011; 2013   COLONOSCOPY WITH PROPOFOL N/A 04/06/2015   Procedure: COLONOSCOPY WITH PROPOFOL;  Surgeon: Josefine Class, MD;  Location: [facility] ENDOSCOPY;  Service: Endoscopy;  Laterality: N/A;   TONSILLECTOMY     TRIGGER FINGER RELEASE Left 10/22/12   Thumb   Meds:  -tylenol -denies taking prescribed medications, vitamins or supplements  Social:  Lives: alone, family has sensors and cameras in place Occupation: retired Support: son and daughter-in-law PCP: Dr. Tracie Harrier Substances: denies tobacco, alcohol or recreational drug use  Family History:  -mother had MI -family history of HLD  Allergies: Allergies as of 03/01/2022   (No Known Allergies)    Review of Systems: A complete ROS was negative except as per HPI.   OBJECTIVE:   Physical Exam: Blood pressure 137/83, pulse (!) 114, temperature 98.5 F (36.9 C), temperature source Axillary, resp. rate 19, height '5\' 11"'$  (1.803 m), weight 75 kg, SpO2 97 %.  Constitutional: ill-appearing male, laying in bed, in no acute distress HENT: normocephalic atraumatic Neck: c-spine collar in place Cardiovascular: irregular rhythm, tachycardia Pulmonary/Chest: normal work of breathing on room air, lungs clear anteriorly  Abdominal: soft, non-tender, non-distended MSK: normal bulk and tone Neurological: alert & oriented x 1 only to self, unable to follow commands Skin: warm and dry, bruising noted on left UE, abrasion  on left forearm, bruising on left lateral hip, mild swelling of left eye  Labs: CBC    Component Value Date/Time   WBC 24.1 (H) 03/01/2022 1135   RBC 4.04 (L) 03/01/2022 1135   HGB 13.6 03/01/2022 1141   HGB 16.4 10/19/2013 2051   HCT 40.0 03/01/2022 1141   HCT 49.3 10/19/2013 2051   PLT 290 03/01/2022 1135   PLT 258 10/19/2013 2051   MCV 95.5 03/01/2022 1135   MCV 95 10/19/2013 2051   MCH 32.2 03/01/2022 1135   MCHC 33.7 03/01/2022  1135   RDW 13.8 03/01/2022 1135   RDW 13.3 10/19/2013 2051   LYMPHSABS 0.6 (L) 03/01/2022 1135   LYMPHSABS 1.4 10/19/2013 2051   MONOABS 1.1 (H) 03/01/2022 1135   MONOABS 1.2 (H) 10/19/2013 2051   EOSABS 0.0 03/01/2022 1135   EOSABS 0.1 10/19/2013 2051   BASOSABS 0.0 03/01/2022 1135   BASOSABS 0.1 10/19/2013 2051     CMP     Component Value Date/Time   NA 139 03/01/2022 1141   NA 140 10/19/2013 2051   K 4.3 03/01/2022 1141   K 4.0 10/19/2013 2051   CL 106 03/01/2022 1141   CL 106 10/19/2013 2051   CO2 19 (L) 03/01/2022 1135   CO2 25 10/19/2013 2051   GLUCOSE 144 (H) 03/01/2022 1141   GLUCOSE 108 (H) 10/19/2013 2051   BUN 28 (H) 03/01/2022 1141   BUN 26 (H) 10/19/2013 2051   CREATININE 1.40 (H) 03/01/2022 1141   CREATININE 1.44 (H) 10/19/2013 2051   CALCIUM 9.7 03/01/2022 1135   CALCIUM 9.6 10/19/2013 2051   PROT 6.7 03/01/2022 1135   PROT 8.1 10/19/2013 2051   ALBUMIN 4.1 03/01/2022 1135   ALBUMIN 4.2 10/19/2013 2051   AST 74 (H) 03/01/2022 1135   AST 33 10/19/2013 2051   ALT 21 03/01/2022 1135   ALT 31 10/19/2013 2051   ALKPHOS 74 03/01/2022 1135   ALKPHOS 98 10/19/2013 2051   BILITOT 0.9 03/01/2022 1135   BILITOT 0.9 10/19/2013 2051   GFRNONAA 42 (L) 03/01/2022 1135   GFRNONAA 47 (L) 10/19/2013 2051   GFRAA >60 12/07/2015 0544   GFRAA 55 (L) 10/19/2013 2051    Imaging: MR Cervical Spine Wo Contrast IMPRESSION: 1. Prevertebral fluid from the skull base through C6-7 without adjacent bone edema. This is nonspecific, but can be seen in the setting of acute trauma suggesting anterior ligamentous injury. No associated fracture. 2. Minimal edema along the posterior spinous ligament at the C4-5 level more inferiorly on the left into the paraspinous soft tissues. 3. Moderate left and mild right foraminal narrowing at C3-4. 4. Moderate foraminal narrowing bilaterally at C4-5 is worse on the left. 5. Moderate foraminal narrowing bilaterally at C6-7 is right  greater than left. 6. Mild facet hypertrophy and a broad-based disc protrusion at C7-T1 without significant stenosis.  MR BRAIN WO CONTRAST IMPRESSION: 1. Acute/subacute right MCA territory infarct involving the right caudate head internal capsule and lentiform nucleus as well as more diffusely over the right temporal and parietal lobe as well as the posterior right frontal lobe. 2. Areas of susceptibility within the posterior right lentiform nucleus and caudate head consistent with petechial hemorrhage. 3. Generalized atrophy and white matter disease is otherwise stable. This likely reflects the sequela of chronic microvascular ischemia. 4. Remote lacunar infarcts of the left cerebellum. 5. Diffuse sinus disease.  DG Forearm Left FINDINGS: There is no evidence of fracture or other focal bone lesions. Soft tissues are unremarkable. IMPRESSION:  Negative.  DG Humerus Left FINDINGS: Edema or laceration noted at the elbow. No underlying fracture or foreign body. Humerus is unremarkable. Shoulder is located. IMPRESSION: 1. Edema or laceration at the elbow without underlying fracture or foreign body. 2. No acute osseous abnormality of the left humerus.  CT Maxillofacial Wo Contrast IMPRESSION: 1. Diffuse soft tissue swelling over the left side of the face without underlying fracture. 2. Periorbital soft tissue swelling without underlying fracture. 3. No foreign body. 4. Moderate mucosal thickening within the left greater than right maxillary sinus. No fluid levels are present.  CT CHEST ABDOMEN PELVIS W CONTRAST IMPRESSION: No acute findings are seen in CT scan of chest, abdomen and pelvis. There is no mediastinal or retroperitoneal hematoma. There is no focal pulmonary consolidation.  There is no demonstrable laceration in the solid organs. There is no focal bowel wall thickening. There is no ascites or pneumoperitoneum.  Coronary artery disease. There is 5 cm  infrarenal abdominal aortic aneurysm which measured 4.1 cm in the study done on 01/10/2015.  Recommend follow-up every 6 months and vascular consultation.Reference: J Am Coll Radiol 1771;16:579-038.  Diverticulosis of colon.  Other findings as described in the body of the report.  CT Cervical Spine Wo Contrast IMPRESSION: 1. Prevertebral and left lateral neck edema in the cervical spine without cervical spine fracture or acute malalignment. If no direct trauma to this area consider cervical MRI to evaluate for soft tissue/ligamentous injury. 2. Cervical spine fusion from C2 to C6. Advanced cervical spine degeneration.  CT HEAD WO CONTRAST (5MM) IMPRESSION: No acute intracranial findings are seen in noncontrast CT brain. There is subcutaneous hematoma in the frontal and left parietal scalp. No fracture is seen in calvarium.  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 Small-vessel disease. Atrophy.  DG Pelvis Portable IMPRESSION: 1. No acute fracture or dislocation. 2. Degenerative changes in the lower lumbar spine.  DG Chest Portable 1 View FINDINGS: The heart size and mediastinal contours are within normal limits. Both lungs are clear. The visualized skeletal structures are unremarkable. IMPRESSION: No active disease.   EKG: personally reviewed my interpretation is atrial fibrillation with RVR.   ASSESSMENT & PLAN:   Assessment & Plan by Problem: Principal Problem:   Atrial fibrillation with RVR ([place])   [name] [name] is a 81 y.o. person living with a PMH of prior CVA, mixed Alzheimer's and vascular dementia, Afib not on anticoagulation who presented after fall and admitted for Afib with RVR and acute right MCA stroke on hospital day 0  Fall Acute right MCA CVA Hx of prior CVA Patient presents after fall. No acute fractures noted. Bruising on left UE and left lateral hip. Patient denies any pain. Family notes gradual decline and confusion. MRI showed  acute/subacute right MCA infarct. Remote lacunar infarcts of left cerebellum. Likely from history of Afib and not on anticoagulation. Discussed with son who is HPOA about patient's wishes and goals of care. He states continuing with medical management. Will continue to discuss patient's goals of care given history of declining medications. Patient failed initial swallow study. No hx of DM. Last LDL noted 140 in October.  -ASA rectal  -pending A1c and lipid panel -PT/OT/SLP consult -neuro checks  Afib with RVR Patient with history of Afib that appeared rate controlled previously. Still tachycardic in 100-120. Not on anticoagulation due to patient refusal.  -diltiazem infusion  AKI Initial creatinine 1.62 but improving to 1.40. Appears hypovolemic.  -LR 125 ml/hr  Goals of Care  Son is HPOA. Patient had expressed previously about being DNR. Will continue discussion about goals of care. Would benefit from palliative care consult. -palliative care consult   Diet: NPO VTE: Heparin IVF: LR,125cc/hr Code: DNR  Prior to Admission Living Arrangement: Home, living alone  Anticipated Discharge Location:  TBD Barriers to Discharge: medical management  Dispo: Admit patient to Observation with expected length of stay less than 2 midnights.  Signed: Angelique Blonder, DO Internal Medicine Resident PGY-1  03/01/2022, 7:24 PM

## 2022-03-01 NOTE — ED Provider Notes (Signed)
[facility] EMERGENCY DEPARTMENT Provider Note   CSN: [id number] Arrival date & time: 03/01/22  1133     History {Add pertinent medical, surgical, social history, OB history to HPI:1} Chief Complaint  Patient presents with   Level 1    [name] [name] is a 81 y.o. male.  HPI  Patient brought in by EMS.  Reported fall.  Reportedly fell last night.  Laid on the ground.  Swelling of left face.  May have some mild confusion.  Found to be in A-fib with RVR.  EMS denied history of A-fib but reviewing records it appears that he does have a history of A-fib but not on anticoagulation.  Not moving left upper extremity.  With some swelling.  No fevers.  Later family arrives and states patient is more confused like he has been with UTIs in the past.   Past Medical History:  Diagnosis Date   Arthritis    Cataracts, bilateral    Chickenpox    Dyslipidemia    GERD (gastroesophageal reflux disease)    Rheumatoid arthritis ([place])     Home Medications Prior to Admission medications   Medication Sig Start Date End Date Taking? Authorizing Provider  apixaban (ELIQUIS) 2.5 MG TABS tablet Take 1 tablet (2.5 mg total) by mouth every 12 (twelve) hours 05/30/21     aspirin EC 81 MG EC tablet Take 1 tablet (81 mg total) by mouth daily. 12/07/15   Gladstone Lighter, MD  atorvastatin (LIPITOR) 40 MG tablet Take 1 tablet (40 mg total) by mouth daily at 6 PM. 12/07/15   Gladstone Lighter, MD  diltiazem (CARDIZEM CD) 120 MG 24 hr capsule Take 1 capsule (120 mg total) by mouth once daily 05/30/21     doxycycline (VIBRA-TABS) 100 MG tablet Take 1 tablet by mouth two times daily for 10 days 02/26/21     meloxicam (MOBIC) 15 MG tablet Take 15 mg by mouth daily.    [provider]  omeprazole (PRILOSEC) 20 MG capsule Take 20 mg by mouth as needed.     [provider]  omeprazole (PRILOSEC) 20 MG capsule TAKE 1 CAPSULE BY MOUTH ONCE DAILY 08/10/19 08/25/20  Tracie Harrier, MD   omeprazole (PRILOSEC) 20 MG capsule TAKE 1 CAPSULE BY MOUTH ONCE DAILY 11/02/20     Probiotic Product (ALIGN) 4 MG CAPS Take 1 capsule by mouth daily.    [provider]  sertraline (ZOLOFT) 25 MG tablet Take 1 tablet (25 mg total) by mouth once daily 12/19/20     vitamin B-12 (CYANOCOBALAMIN) 1000 MCG tablet Take 1 tablet (1,000 mcg total) by mouth once daily 10/15/21         Allergies    Patient has no known allergies.    Review of Systems   Review of Systems  Physical Exam Updated Vital Signs BP 109/79   Pulse (!) 129   Temp 98.2 F (36.8 C)   Resp 19   Ht '5\' 11"'$  (1.803 m)   Wt 75 kg   SpO2 98%   BMI 23.06 kg/m  Physical Exam Vitals reviewed.  HENT:     Head:     Comments: Swelling/edema to left upper face. Eyes:     Pupils: Pupils are equal, round, and reactive to light.  Neck:     Comments: Mild cervical spine tenderness.  Cervical collar placed. Cardiovascular:     Rate and Rhythm: Tachycardia present. Rhythm irregular.  Pulmonary:     Breath sounds: No wheezing.  Abdominal:     Tenderness: There is no abdominal tenderness.  Musculoskeletal:        General: Tenderness present.     Cervical back: Tenderness present.     Comments: Ecchymosis and swelling of left forearm and upper arm.  Pulse intact distally.  Sensation grossly intact.  Neurological:     Mental Status: He is alert.     ED Results / Procedures / Treatments   Labs (all labs ordered are listed, but only abnormal results are displayed) Labs Reviewed  CBC WITH DIFFERENTIAL/PLATELET - Abnormal; Notable for the following components:      Result Value   WBC 24.1 (*)    RBC 4.04 (*)    HCT 38.6 (*)    Neutro Abs 22.2 (*)    Lymphs Abs 0.6 (*)    Monocytes Absolute 1.1 (*)    Abs Immature Granulocytes 0.22 (*)    All other components within normal limits  COMPREHENSIVE METABOLIC PANEL - Abnormal; Notable for the following components:   CO2 19 (*)    Glucose, Bld 151 (*)    BUN 28 (*)     Creatinine, Ser 1.62 (*)    AST 74 (*)    GFR, Estimated 42 (*)    All other components within normal limits  CK - Abnormal; Notable for the following components:   Total CK 3,449 (*)    All other components within normal limits  I-STAT CHEM 8, ED - Abnormal; Notable for the following components:   BUN 28 (*)    Creatinine, Ser 1.40 (*)    Glucose, Bld 144 (*)    Calcium, Ion 1.07 (*)    TCO2 19 (*)    All other components within normal limits  ETHANOL  RAPID URINE DRUG SCREEN, HOSP PERFORMED  URINALYSIS, ROUTINE W REFLEX MICROSCOPIC  TYPE AND SCREEN    EKG None  Radiology DG Forearm Left  Result Date: 03/01/2022 CLINICAL DATA:  Acute LEFT arm pain following fall. EXAM: LEFT FOREARM - 2 VIEW COMPARISON:  None Available. FINDINGS: There is no evidence of fracture or other focal bone lesions. Soft tissues are unremarkable. IMPRESSION: Negative. Electronically Signed   By: Margarette Canada M.D.   On: 03/01/2022 13:23   DG Humerus Left  Result Date: 03/01/2022 CLINICAL DATA:  Fall.  Left humerus pain. EXAM: LEFT HUMERUS - 2+ VIEW COMPARISON:  None Available. FINDINGS: Edema or laceration noted at the elbow. No underlying fracture or foreign body. Humerus is unremarkable. Shoulder is located. IMPRESSION: 1. Edema or laceration at the elbow without underlying fracture or foreign body. 2. No acute osseous abnormality of the left humerus. Electronically Signed   By: San Morelle M.D.   On: 03/01/2022 13:22   CT Maxillofacial Wo Contrast  Result Date: 03/01/2022 CLINICAL DATA:  Facial trauma, blunt. Patient found on the floor unresponsive. Fall recorded. EXAM: CT MAXILLOFACIAL WITHOUT CONTRAST TECHNIQUE: Multidetector CT imaging of the maxillofacial structures was performed. Multiplanar CT image reconstructions were also generated. RADIATION DOSE REDUCTION: This exam was performed according to the departmental dose-optimization program which includes automated exposure control,  adjustment of the mA and/or kV according to patient size and/or use of iterative reconstruction technique. COMPARISON:  None Available. FINDINGS: Osseous: No acute fracture is present. The mandible is intact and located. Nasal bones are within normal limits. Orbits: Bilateral lens replacements are noted. Globes and orbits are otherwise unremarkable. Sinuses: Moderate mucosal thickening is present within the left greater than right maxillary sinus. No fluid levels  are present. Mild mucosal thickening is present in the left frontal sinus. The paranasal sinuses and mastoid air cells are otherwise clear. Soft tissues: Diffuse soft tissue swelling is present over the left side of the face. Periorbital soft tissue swelling is present without underlying fracture. No foreign body is present. Stranding extends into the left neck. No focal laceration or fluid collection is present. Limited intracranial: Within normal limits. IMPRESSION: 1. Diffuse soft tissue swelling over the left side of the face without underlying fracture. 2. Periorbital soft tissue swelling without underlying fracture. 3. No foreign body. 4. Moderate mucosal thickening within the left greater than right maxillary sinus. No fluid levels are present. Electronically Signed   By: San Morelle M.D.   On: 03/01/2022 13:05   CT CHEST ABDOMEN PELVIS W CONTRAST  Result Date: 03/01/2022 CLINICAL DATA:  Trauma EXAM: CT CHEST, ABDOMEN, AND PELVIS WITH CONTRAST TECHNIQUE: Multidetector CT imaging of the chest, abdomen and pelvis was performed following the standard protocol during bolus administration of intravenous contrast. RADIATION DOSE REDUCTION: This exam was performed according to the departmental dose-optimization program which includes automated exposure control, adjustment of the mA and/or kV according to patient size and/or use of iterative reconstruction technique. CONTRAST:  40m OMNIPAQUE IOHEXOL 350 MG/ML SOLN COMPARISON:  CT abdomen and  pelvis done on 01/10/2015 FINDINGS: CT CHEST FINDINGS Cardiovascular: Coronary artery calcifications are seen. Dense calcification is seen in mitral annulus. Major vascular structures in mediastinum appear intact. Mediastinum/Nodes: There is no mediastinal hematoma. Lungs/Pleura: There is no focal pulmonary consolidation. There is no pleural effusion or pneumothorax. Few blebs are seen in the apices. Musculoskeletal: No acute findings are seen in bony structures in the thorax. Deformity is seen in the anterolateral aspect of left eleventh rib suggests old fracture. There is deformity in the anterior end of left ninth rib, possibly residual from previous injury. CT ABDOMEN PELVIS FINDINGS Hepatobiliary: There is no demonstrable focal laceration. There is mild prominence of intrahepatic bile ducts. Gallbladder is not distended. Pancreas: No focal abnormalities are seen. Spleen: Unremarkable. Adrenals/Urinary Tract: Adrenals are unremarkable. There is no hydronephrosis. There is no demonstrable cortical laceration. There is no significant perinephric fluid collection. There are no renal or ureteral stones. Urinary bladder is unremarkable. Stomach/Bowel: Small hiatal hernia is seen. Stomach is unremarkable. Small bowel loops are not dilated. Appendix is not dilated. Multiple diverticula are seen in colon without signs of focal wall thickening. There is no pericolic stranding. Vascular/Lymphatic: There is 5 cm infrarenal abdominal aortic aneurysm which measured 4.1 cm in the previous study. Part of the lumen of aneurysm is filled with thrombus. There is no evidence of retroperitoneal hematoma. Extensive scattered calcifications are seen in aorta and its major branches. Reproductive: Unremarkable. Other: There is no ascites or pneumoperitoneum. Bilateral inguinal hernias containing fat are noted, larger on the left side. Small umbilical hernia containing fat is seen. There is mild stranding in subcutaneous plane adjacent  to the left iliac bone, possibly soft tissue contusion without any demonstrable large hematoma. Musculoskeletal: Degenerative changes are noted in sternoclavicular joints, cervical spine, thoracic spine and lumbar spine. There is minimal anterolisthesis at L4-L5 level. There is spinal stenosis and encroachment of neural foramina is seen at multiple levels in the lumbar spine. IMPRESSION: No acute findings are seen in CT scan of chest, abdomen and pelvis. There is no mediastinal or retroperitoneal hematoma. There is no focal pulmonary consolidation. There is no demonstrable laceration in the solid organs. There is no focal bowel wall thickening. There  is no ascites or pneumoperitoneum. Coronary artery disease. There is 5 cm infrarenal abdominal aortic aneurysm which measured 4.1 cm in the study done on 01/10/2015. Recommend follow-up every 6 months and vascular consultation.Reference: J Am Coll Radiol 7989;21:194-174. Diverticulosis of colon. Other findings as described in the body of the report. Electronically Signed   By: Elmer Picker M.D.   On: 03/01/2022 12:56   CT Cervical Spine Wo Contrast  Result Date: 03/01/2022 CLINICAL DATA:  Neck trauma EXAM: CT CERVICAL SPINE WITHOUT CONTRAST TECHNIQUE: Multidetector CT imaging of the cervical spine was performed without intravenous contrast. Multiplanar CT image reconstructions were also generated. RADIATION DOSE REDUCTION: This exam was performed according to the departmental dose-optimization program which includes automated exposure control, adjustment of the mA and/or kV according to patient size and/or use of iterative reconstruction technique. COMPARISON:  12/05/2015 FINDINGS: Alignment: No traumatic malalignment. Mild C7-T1 anterolisthesis that was also seen previously. Skull base and vertebrae: No acute fracture or aggressive bone lesion. Generalized osteopenia. Soft tissues and spinal canal: Prevertebral and left lateral neck edema in the upper  cervical spine. The lateral neck involvement is atypical for a primary cervical spine injury, question direct contusion. Disc levels: Bony fusion from C2-C6. Advanced adjacent segment disc degeneration at C6-7 and facet degeneration at C1-2 especially on the left where there is likely C2 nerve root impingement. Degenerative facet spurring causes mild anterolisthesis. Upper chest: Reported separately IMPRESSION: 1. Prevertebral and left lateral neck edema in the cervical spine without cervical spine fracture or acute malalignment. If no direct trauma to this area consider cervical MRI to evaluate for soft tissue/ligamentous injury. 2. Cervical spine fusion from C2 to C6. Advanced cervical spine degeneration. Electronically Signed   By: Jorje Guild M.D.   On: 03/01/2022 12:45   CT HEAD WO CONTRAST (5MM)  Result Date: 03/01/2022 CLINICAL DATA:  Trauma EXAM: CT HEAD WITHOUT CONTRAST TECHNIQUE: Contiguous axial images were obtained from the base of the skull through the vertex without intravenous contrast. RADIATION DOSE REDUCTION: This exam was performed according to the departmental dose-optimization program which includes automated exposure control, adjustment of the mA and/or kV according to patient size and/or use of iterative reconstruction technique. COMPARISON:  03/21/2008 FINDINGS: Brain: No acute intracranial findings are seen. There are no signs of bleeding within the cranium. Cortical sulci are prominent. There is a encephalomalacia in the left frontoparietal cortex, possibly old infarct. There is decreased density in periventricular and subcortical white matter suggesting small-vessel disease. Vascular: Unremarkable. Skull: No fracture is seen in calvarium. There is subcutaneous hematoma in the anterior frontal and left parietal scalp. Sinuses/Orbits: There is mucosal thickening in ethmoid and maxillary sinuses. There are no air-fluid levels. Other: None. IMPRESSION: No acute intracranial findings  are seen in noncontrast CT brain. There is subcutaneous hematoma in the frontal and left parietal scalp. No fracture is seen in calvarium. 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 Small-vessel disease. Atrophy. Electronically Signed   By: Elmer Picker M.D.   On: 03/01/2022 12:33   DG Pelvis Portable  Result Date: 03/01/2022 CLINICAL DATA:  Level 1 trauma.  Patient found down. EXAM: PORTABLE PELVIS 1-2 VIEWS COMPARISON:  CT the abdomen and pelvis 01/10/2015 FINDINGS: No acute fractures are present. Hips appear located bilaterally. Degenerative changes are present in the lower lumbar spine. IMPRESSION: 1. No acute fracture or dislocation. 2. Degenerative changes in the lower lumbar spine. Electronically Signed   By: San Morelle M.D.   On: 03/01/2022 12:00  DG Chest Portable 1 View  Result Date: 03/01/2022 CLINICAL DATA:  Level 1 trauma.  Patient found down. EXAM: PORTABLE CHEST 1 VIEW COMPARISON:  None Available. FINDINGS: The heart size and mediastinal contours are within normal limits. Both lungs are clear. The visualized skeletal structures are unremarkable. IMPRESSION: No active disease. Electronically Signed   By: San Morelle M.D.   On: 03/01/2022 11:59    Procedures Procedures  {Document cardiac monitor, telemetry assessment procedure when appropriate:1}  Medications Ordered in ED Medications  lactated ringers infusion (has no administration in time range)  lactated ringers bolus 500 mL (has no administration in time range)  diltiazem (CARDIZEM) 125 mg in dextrose 5% 125 mL (1 mg/mL) infusion (has no administration in time range)  iohexol (OMNIPAQUE) 350 MG/ML injection 75 mL (75 mLs Intravenous Contrast Given 03/01/22 1217)    ED Course/ Medical Decision Making/ A&P                           Medical Decision Making Amount and/or Complexity of Data Reviewed Labs: ordered. Radiology: ordered.  Risk Prescription drug management. Decision regarding  hospitalization.   Patient presented after fall.  Confused.  Reported unresponsive for EMS from able to answer questions and talk.  Moving everything except left upper extremity.  Some tenderness in this area.  Had been activated as a level 1 trauma.  Blood pressure reassuring.  Chest x-ray and pelvis x-ray reassuring.  Taken to CT scan and did not show severe traumatic injury of the cervical spine x-ray does not show fracture does have some prevertebral edema.  With the fall will get MRI to further evaluate.  We will also do MRI of head evaluate for potential stroke since does have some what decreased left-sided movement.  Not a TNK candidate due to time of onset and due to the fall.  Creatinine mildly above baseline.  Found in A-fib and does have history of A-fib but not on anticoagulation.  We will give fluid bolus and start some Cardizem.  We will also get urine sample but has not been able to provide urine yet.  White count is elevated but not clearly infected at this time.  Will not empirically treat. Appears to be in rhabdomyolysis CK of 3000.  We will give fluid bolus.  From a trauma standpoint has been cleared by trauma surgery for medicine admission.  Forearm and humerus x-ray show no fracture.  Chadsvasc 4  Discussed with internal medicine residents, who will admit patient.   CRITICAL CARE Performed by: Davonna Belling Total critical care time: 30  minutes Critical care time was exclusive of separately billable procedures and treating other patients. Critical care was necessary to treat or prevent imminent or life-threatening deterioration. Critical care was time spent personally by me on the following activities: development of treatment plan with patient and/or surrogate as well as nursing, discussions with consultants, evaluation of patient's response to treatment, examination of patient, obtaining history from patient or surrogate, ordering and performing treatments and interventions,  ordering and review of laboratory studies, ordering and review of radiographic studies, pulse oximetry and re-evaluation of patient's condition.     {Document critical care time when appropriate:1} {Document review of labs and clinical decision tools ie heart score, Chads2Vasc2 etc:1}  {Document your independent review of radiology images, and any outside records:1} {Document your discussion with family members, caretakers, and with consultants:1} {Document social determinants of health affecting pt's care:1} {Document your decision making  why or why not admission, treatments were needed:1} Final Clinical Impression(s) / ED Diagnoses Final diagnoses:  Fall, initial encounter  Traumatic rhabdomyolysis, initial encounter ([place])  Contusion of left forearm, initial encounter  Atrial fibrillation with rapid ventricular response ([place])    Rx / DC Orders ED Discharge Orders     None

## 2022-03-01 NOTE — ED Notes (Signed)
Family at bedside given an update 

## 2022-03-01 NOTE — ED Triage Notes (Signed)
[facility] after being found on the floor unresponsive by his neighbor. Deformity to the right humerus. Pt LKW was 12am and in house cameras saw him fall at this time    169/104 26 RR 145 HR

## 2022-03-01 NOTE — Plan of Care (Signed)
Discussed briefly with admitting team as a curbside.  Patient with known dementia, afib not on AC as he refuses medications presenting with a large stroke likely due to Afib not on [facility]   Recommend goals of care discussions, if patient is willing/able to take medications happy to do full evaluation for when anticoagulation may be restarted from a risk/benefit perspective. But no benefit to workup if patient unwilling/unable to take medications.   Neurology available if needed  Lesleigh Noe MD-PhD [facility] [phone].  Available 7 AM to 7 PM, outside these hours please contact Neurologist on call listed on AMION

## 2022-03-01 NOTE — ED Notes (Signed)
Patient transported to CT with RN and trauma team

## 2022-03-01 NOTE — Progress Notes (Signed)
Chaplain responded to Trauma L1 fall on thinners, pt is resting, did not rouse to soft greeting. No family is present. Please call as needed for support.   [place], [place]  Page:  [phone] Text: [phone]   03/01/22 1200  Clinical Encounter Type  Visited With Patient;Patient not available  Visit Type Initial;Follow-up  Stress Factors  Patient Stress Factors Health changes

## 2022-03-01 NOTE — ED Notes (Signed)
Pt transported to MRI with RN

## 2022-03-02 ENCOUNTER — Other Ambulatory Visit (HOSPITAL_COMMUNITY): Payer: Medicare Other

## 2022-03-02 DIAGNOSIS — G9341 Metabolic encephalopathy: Secondary | ICD-10-CM | POA: Diagnosis not present

## 2022-03-02 DIAGNOSIS — G9349 Other encephalopathy: Secondary | ICD-10-CM | POA: Diagnosis not present

## 2022-03-02 DIAGNOSIS — S5012XA Contusion of left forearm, initial encounter: Secondary | ICD-10-CM | POA: Diagnosis not present

## 2022-03-02 DIAGNOSIS — I4891 Unspecified atrial fibrillation: Secondary | ICD-10-CM | POA: Diagnosis present

## 2022-03-02 DIAGNOSIS — S0012XA Contusion of left eyelid and periocular area, initial encounter: Secondary | ICD-10-CM | POA: Diagnosis present

## 2022-03-02 DIAGNOSIS — W19XXXA Unspecified fall, initial encounter: Secondary | ICD-10-CM | POA: Diagnosis present

## 2022-03-02 DIAGNOSIS — K2211 Ulcer of esophagus with bleeding: Secondary | ICD-10-CM | POA: Diagnosis not present

## 2022-03-02 DIAGNOSIS — N179 Acute kidney failure, unspecified: Secondary | ICD-10-CM | POA: Diagnosis present

## 2022-03-02 DIAGNOSIS — M069 Rheumatoid arthritis, unspecified: Secondary | ICD-10-CM | POA: Diagnosis present

## 2022-03-02 DIAGNOSIS — I7143 Infrarenal abdominal aortic aneurysm, without rupture: Secondary | ICD-10-CM | POA: Diagnosis present

## 2022-03-02 DIAGNOSIS — G8194 Hemiplegia, unspecified affecting left nondominant side: Secondary | ICD-10-CM | POA: Diagnosis present

## 2022-03-02 DIAGNOSIS — W1830XA Fall on same level, unspecified, initial encounter: Secondary | ICD-10-CM | POA: Diagnosis present

## 2022-03-02 DIAGNOSIS — Z515 Encounter for palliative care: Secondary | ICD-10-CM

## 2022-03-02 DIAGNOSIS — Z7189 Other specified counseling: Secondary | ICD-10-CM

## 2022-03-02 DIAGNOSIS — K2100 Gastro-esophageal reflux disease with esophagitis, without bleeding: Secondary | ICD-10-CM | POA: Diagnosis not present

## 2022-03-02 DIAGNOSIS — T796XXA Traumatic ischemia of muscle, initial encounter: Secondary | ICD-10-CM | POA: Diagnosis present

## 2022-03-02 DIAGNOSIS — Z79899 Other long term (current) drug therapy: Secondary | ICD-10-CM | POA: Diagnosis not present

## 2022-03-02 DIAGNOSIS — K2210 Ulcer of esophagus without bleeding: Secondary | ICD-10-CM | POA: Diagnosis not present

## 2022-03-02 DIAGNOSIS — E785 Hyperlipidemia, unspecified: Secondary | ICD-10-CM | POA: Diagnosis present

## 2022-03-02 DIAGNOSIS — E861 Hypovolemia: Secondary | ICD-10-CM | POA: Diagnosis present

## 2022-03-02 DIAGNOSIS — F02A0 Dementia in other diseases classified elsewhere, mild, without behavioral disturbance, psychotic disturbance, mood disturbance, and anxiety: Secondary | ICD-10-CM | POA: Diagnosis present

## 2022-03-02 DIAGNOSIS — K920 Hematemesis: Secondary | ICD-10-CM | POA: Diagnosis present

## 2022-03-02 DIAGNOSIS — R4701 Aphasia: Secondary | ICD-10-CM | POA: Diagnosis present

## 2022-03-02 DIAGNOSIS — Z87891 Personal history of nicotine dependence: Secondary | ICD-10-CM | POA: Diagnosis not present

## 2022-03-02 DIAGNOSIS — I639 Cerebral infarction, unspecified: Secondary | ICD-10-CM

## 2022-03-02 DIAGNOSIS — G309 Alzheimer's disease, unspecified: Secondary | ICD-10-CM | POA: Diagnosis present

## 2022-03-02 DIAGNOSIS — K449 Diaphragmatic hernia without obstruction or gangrene: Secondary | ICD-10-CM | POA: Diagnosis not present

## 2022-03-02 DIAGNOSIS — Z66 Do not resuscitate: Secondary | ICD-10-CM | POA: Diagnosis present

## 2022-03-02 DIAGNOSIS — I63511 Cerebral infarction due to unspecified occlusion or stenosis of right middle cerebral artery: Secondary | ICD-10-CM | POA: Diagnosis present

## 2022-03-02 DIAGNOSIS — I10 Essential (primary) hypertension: Secondary | ICD-10-CM | POA: Diagnosis not present

## 2022-03-02 DIAGNOSIS — Y92009 Unspecified place in unspecified non-institutional (private) residence as the place of occurrence of the external cause: Secondary | ICD-10-CM | POA: Diagnosis not present

## 2022-03-02 DIAGNOSIS — D649 Anemia, unspecified: Secondary | ICD-10-CM | POA: Diagnosis present

## 2022-03-02 DIAGNOSIS — R414 Neurologic neglect syndrome: Secondary | ICD-10-CM | POA: Diagnosis present

## 2022-03-02 DIAGNOSIS — K2101 Gastro-esophageal reflux disease with esophagitis, with bleeding: Secondary | ICD-10-CM | POA: Diagnosis not present

## 2022-03-02 DIAGNOSIS — I63411 Cerebral infarction due to embolism of right middle cerebral artery: Secondary | ICD-10-CM | POA: Diagnosis not present

## 2022-03-02 DIAGNOSIS — F01A0 Vascular dementia, mild, without behavioral disturbance, psychotic disturbance, mood disturbance, and anxiety: Secondary | ICD-10-CM | POA: Diagnosis present

## 2022-03-02 LAB — CBC
HCT: 34.6 % — ABNORMAL LOW (ref 39.0–52.0)
Hemoglobin: 11.4 g/dL — ABNORMAL LOW (ref 13.0–17.0)
MCH: 31.9 pg (ref 26.0–34.0)
MCHC: 32.9 g/dL (ref 30.0–36.0)
MCV: 96.9 fL (ref 80.0–100.0)
Platelets: 229 10*3/uL (ref 150–400)
RBC: 3.57 MIL/uL — ABNORMAL LOW (ref 4.22–5.81)
RDW: 13.9 % (ref 11.5–15.5)
WBC: 18.4 10*3/uL — ABNORMAL HIGH (ref 4.0–10.5)
nRBC: 0 % (ref 0.0–0.2)

## 2022-03-02 LAB — CBC WITH DIFFERENTIAL/PLATELET
Abs Immature Granulocytes: 0.16 10*3/uL — ABNORMAL HIGH (ref 0.00–0.07)
Basophils Absolute: 0 10*3/uL (ref 0.0–0.1)
Basophils Relative: 0 %
Eosinophils Absolute: 0 10*3/uL (ref 0.0–0.5)
Eosinophils Relative: 0 %
HCT: 33.3 % — ABNORMAL LOW (ref 39.0–52.0)
Hemoglobin: 11.7 g/dL — ABNORMAL LOW (ref 13.0–17.0)
Immature Granulocytes: 1 %
Lymphocytes Relative: 7 %
Lymphs Abs: 1.4 10*3/uL (ref 0.7–4.0)
MCH: 32.6 pg (ref 26.0–34.0)
MCHC: 35.1 g/dL (ref 30.0–36.0)
MCV: 92.8 fL (ref 80.0–100.0)
Monocytes Absolute: 1.3 10*3/uL — ABNORMAL HIGH (ref 0.1–1.0)
Monocytes Relative: 7 %
Neutro Abs: 16.4 10*3/uL — ABNORMAL HIGH (ref 1.7–7.7)
Neutrophils Relative %: 85 %
Platelets: 253 10*3/uL (ref 150–400)
RBC: 3.59 MIL/uL — ABNORMAL LOW (ref 4.22–5.81)
RDW: 14 % (ref 11.5–15.5)
WBC: 19.3 10*3/uL — ABNORMAL HIGH (ref 4.0–10.5)
nRBC: 0 % (ref 0.0–0.2)

## 2022-03-02 LAB — PHOSPHORUS: Phosphorus: 2.6 mg/dL (ref 2.5–4.6)

## 2022-03-02 LAB — BASIC METABOLIC PANEL
Anion gap: 9 (ref 5–15)
BUN: 28 mg/dL — ABNORMAL HIGH (ref 8–23)
CO2: 20 mmol/L — ABNORMAL LOW (ref 22–32)
Calcium: 9.3 mg/dL (ref 8.9–10.3)
Chloride: 109 mmol/L (ref 98–111)
Creatinine, Ser: 1.5 mg/dL — ABNORMAL HIGH (ref 0.61–1.24)
GFR, Estimated: 46 mL/min — ABNORMAL LOW (ref >60)
Glucose, Bld: 118 mg/dL — ABNORMAL HIGH (ref 70–99)
Potassium: 4.4 mmol/L (ref 3.5–5.1)
Sodium: 138 mmol/L (ref 135–145)

## 2022-03-02 LAB — MAGNESIUM: Magnesium: 2.1 mg/dL (ref 1.7–2.4)

## 2022-03-02 MED ORDER — PANTOPRAZOLE SODIUM 40 MG IV SOLR
40.0000 mg | Freq: Once | INTRAVENOUS | Status: AC
  Administered 2022-03-02: 40 mg via INTRAVENOUS
  Filled 2022-03-02: qty 10

## 2022-03-02 MED ORDER — PANTOPRAZOLE SODIUM 40 MG IV SOLR
40.0000 mg | Freq: Two times a day (BID) | INTRAVENOUS | Status: DC
  Administered 2022-03-02 – 2022-03-06 (×10): 40 mg via INTRAVENOUS
  Filled 2022-03-02 (×10): qty 10

## 2022-03-02 MED ORDER — SODIUM CHLORIDE 0.9 % IV SOLN: INTRAVENOUS | Status: DC

## 2022-03-02 MED ORDER — ACETAMINOPHEN 650 MG RE SUPP
650.0000 mg | Freq: Four times a day (QID) | RECTAL | Status: DC | PRN
  Administered 2022-03-02: 650 mg via RECTAL
  Filled 2022-03-02: qty 1

## 2022-03-02 NOTE — Progress Notes (Signed)
Transition of Care [facility]) - CAGE-AID Screening   Patient Details  Name: [name] [name] MRN: [id number] Date of Birth: 17-Feb-1941   Clinical Narrative:  [name] [name] is a 81 y.o. male with PMH of prior CVA, mixed Alzheimer's and vascular dementia, Afib not on anticoagulation presents after fall last night. Son was present at bedside and provided most of the information. Son states patient lives alone but has cameras and sensors in place. Cameras noted fall overnight and patient was unable to get up. Patient states he felt dizzy and tried to hold onto counter but fell to the floor. Patient denies any pain after fall. No headache, n/v or dizziness. Bruising and abrasion noted for the left arm. Bruising around left eye and left hip.    Family has noted gradual decline for months. Able to hold conversation but has confusion at baseline. Patient does not take any medications except for tylenol as needed. He does not want to take prescribed medications. Son states he is working on finding a possible nursing facility given continued decline. Patient has been resistant to moving from his house.    Pt not at risk for alcohol or drug use or abuse.   CAGE-AID Screening:    Have You Ever Felt You Ought to Cut Down on Your Drinking or Drug Use?: No Have People Annoyed You By Critizing Your Drinking Or Drug Use?: No Have You Felt Bad Or Guilty About Your Drinking Or Drug Use?: No Have You Ever Had a Drink or Used Drugs First Thing In The Morning to Steady Your Nerves or to Get Rid of a Hangover?: No CAGE-AID Score: 0  Substance Abuse Education Offered: No

## 2022-03-02 NOTE — Progress Notes (Signed)
OT Cancellation Note  Patient Details Name: [name] [name] MRN: [id number] DOB: 1940-08-10   Cancelled Treatment:    Reason Eval/Treat Not Completed: Medical issues which prohibited therapy (pt vomiting blood per RN, also of note palliative meeting scheduled for today. OT will hold evaluation until appropriate.)  Renaye Rakers, OTD, OTR/L [facility] [phone]   Ulla Gallo 03/02/2022, 10:13 AM

## 2022-03-02 NOTE — Plan of Care (Signed)

## 2022-03-02 NOTE — Progress Notes (Signed)
[facility] Progress Note     Subjective: CC-  Patient is tired this morning. Wakes up intermittently but mostly only mumbles. Palliative meeting   Objective: Vital signs in last 24 hours: Temp:  [98.1 F (36.7 C)-100.4 F (38 C)] 99.3 F (37.4 C) (12/03 0812) Pulse Rate:  [76-136] 93 (12/03 0812) Resp:  [14-32] 20 (12/03 0812) BP: (104-137)/(60-100) 134/76 (12/03 0812) SpO2:  [94 %-100 %] 94 % (12/03 0812) Weight:  [74.1 kg-75 kg] 74.1 kg (12/02 1851)    Intake/Output from previous day: 12/02 0701 - 12/03 0700 In: 376.7 [I.V.:376.7] Out: 150 [Urine:150] Intake/Output this shift: No intake/output data recorded.  PE: Gen:  drowsy but does arouse some HEENT: PERRL. Mild left periorbital edema. C-collar in place Card:  irregular rhythm, regular rate 70s Pulm:  CTAB, no W/R/R, rate and effort normal on room air Abd: Soft, NT/ND Neuro: he does MAE but limited movement in the LUE. Does not f/c Msk: ecchymosis noted to the left forearm  Psych: unable to assess, mostly mumbles Skin: no rashes noted, warm and dry  Lab Results:  Recent Labs    03/01/22 1135 03/01/22 1141 03/02/22 0119  WBC 24.1*  --  19.3*  HGB 13.0 13.6 11.7*  HCT 38.6* 40.0 33.3*  PLT 290  --  253   BMET Recent Labs    03/01/22 1135 03/01/22 1141 03/02/22 0119  NA 138 139 138  K 4.2 4.3 4.4  CL 106 106 109  CO2 19*  --  20*  GLUCOSE 151* 144* 118*  BUN 28* 28* 28*  CREATININE 1.62* 1.40* 1.50*  CALCIUM 9.7  --  9.3   PT/INR No results for input(s): "LABPROT", "INR" in the last 72 hours. CMP     Component Value Date/Time   NA 138 03/02/2022 0119   NA 140 10/19/2013 2051   K 4.4 03/02/2022 0119   K 4.0 10/19/2013 2051   CL 109 03/02/2022 0119   CL 106 10/19/2013 2051   CO2 20 (L) 03/02/2022 0119   CO2 25 10/19/2013 2051   GLUCOSE 118 (H) 03/02/2022 0119   GLUCOSE 108 (H) 10/19/2013 2051   BUN 28 (H) 03/02/2022 0119   BUN 26 (H) 10/19/2013 2051   CREATININE 1.50 (H)  03/02/2022 0119   CREATININE 1.44 (H) 10/19/2013 2051   CALCIUM 9.3 03/02/2022 0119   CALCIUM 9.6 10/19/2013 2051   PROT 6.7 03/01/2022 1135   PROT 8.1 10/19/2013 2051   ALBUMIN 4.1 03/01/2022 1135   ALBUMIN 4.2 10/19/2013 2051   AST 74 (H) 03/01/2022 1135   AST 33 10/19/2013 2051   ALT 21 03/01/2022 1135   ALT 31 10/19/2013 2051   ALKPHOS 74 03/01/2022 1135   ALKPHOS 98 10/19/2013 2051   BILITOT 0.9 03/01/2022 1135   BILITOT 0.9 10/19/2013 2051   GFRNONAA 46 (L) 03/02/2022 0119   GFRNONAA 47 (L) 10/19/2013 2051   GFRAA >60 12/07/2015 0544   GFRAA 55 (L) 10/19/2013 2051   Lipase     Component Value Date/Time   LIPASE 95 10/19/2013 2051       Studies/Results: MR Cervical Spine Wo Contrast  Result Date: 03/01/2022 CLINICAL DATA:  Neck trauma. Ligament injury suspected. Patient was found down. EXAM: MRI CERVICAL SPINE WITHOUT CONTRAST TECHNIQUE: Multiplanar, multisequence MR imaging of the cervical spine was performed. No intravenous contrast was administered. COMPARISON:  CT of the cervical spine without contrast 03/01/2022 FINDINGS: Alignment: Slight anterolisthesis at C7-T1 and reversal of the upper cervical lordosis is stable. Vertebrae:  Marrow signal and vertebral body heights in the cervical spine are normal. Diffuse decreased marrow signal is present in the skull. No discrete lesions are present. Cord: Normal signal and morphology. Posterior Fossa, vertebral arteries, paraspinal tissues: Craniocervical junction is within normal limits. Prevertebral fluid is evident from the skull base through C6-7. No adjacent bone edema is present. Minimal edema is present along the posterior spinous ligament at the C4-5 level more inferiorly on the left into the paraspinous soft tissues. Disc levels: C2-3: Negative. C3-4: Asymmetric left-sided uncovertebral and facet hypertrophy present. Moderate left and mild right foraminal narrowing is present. C4-5: A broad-based disc osteophyte complex  effaces the ventral CSF. Moderate foraminal narrowing is worse on the left. C5-6: Shallow central disc protrusion partially effaces the ventral CSF. The foramina are patent. C6-7: A broad-based disc osteophyte complex present. Uncovertebral spurring results in moderate foraminal narrowing, right greater than left. C7-T1: Mild facet hypertrophy is present bilaterally. Uncovering of a broad-based disc protrusion is present. No significant stenosis is present. IMPRESSION: 1. Prevertebral fluid from the skull base through C6-7 without adjacent bone edema. This is nonspecific, but can be seen in the setting of acute trauma suggesting anterior ligamentous injury. No associated fracture. 2. Minimal edema along the posterior spinous ligament at the C4-5 level more inferiorly on the left into the paraspinous soft tissues. 3. Moderate left and mild right foraminal narrowing at C3-4. 4. Moderate foraminal narrowing bilaterally at C4-5 is worse on the left. 5. Moderate foraminal narrowing bilaterally at C6-7 is right greater than left. 6. Mild facet hypertrophy and a broad-based disc protrusion at C7-T1 without significant stenosis. Electronically Signed   By: San Morelle M.D.   On: 03/01/2022 17:54   MR BRAIN WO CONTRAST  Result Date: 03/01/2022 CLINICAL DATA:  Head trauma.  Patient found down. EXAM: MRI HEAD WITHOUT CONTRAST TECHNIQUE: Multiplanar, multiecho pulse sequences of the brain and surrounding structures were obtained without intravenous contrast. COMPARISON:  CT head without contrast 03/01/2022 FINDINGS: Brain: The diffusion-weighted images demonstrate no acute right MCA territory infarct. The infarct involves the right caudate head internal capsule and lentiform nucleus. The insular cortex is spared. Cortical and subcortical involvement of the right parietal and temporal lobe is present. Posterior right frontal lobe is involved. Areas of susceptibility are present within the posterior right lentiform  nucleus and caudate head consistent with petechial hemorrhage. Generalized atrophy and white matter disease is otherwise stable. No significant extraaxial fluid collection is present. Remote lacunar infarcts are present within the left cerebellum. Brainstem and cerebellum are otherwise within normal limits. Vascular: Flow is present in the major intracranial arteries. Skull and upper cervical spine: The craniocervical junction is normal. Upper cervical spine is within normal limits. Degenerative changes are present in the upper cervical spine. Craniocervical junction is normal. Marrow signal in the skull is diffusely depressed. No discrete lesions are evident. Sinuses/Orbits: Circumferential mucosal thickening is present in the maxillary sinuses bilaterally. No fluid levels are present. Scattered mucosal thickening is present throughout the ethmoid air cells and left frontal sinus. Mastoid air cells are clear. Bilateral lens replacements are noted. Globes and orbits are otherwise unremarkable. IMPRESSION: 1. Acute/subacute right MCA territory infarct involving the right caudate head internal capsule and lentiform nucleus as well as more diffusely over the right temporal and parietal lobe as well as the posterior right frontal lobe. 2. Areas of susceptibility within the posterior right lentiform nucleus and caudate head consistent with petechial hemorrhage. 3. Generalized atrophy and white matter disease is otherwise  stable. This likely reflects the sequela of chronic microvascular ischemia. 4. Remote lacunar infarcts of the left cerebellum. 5. Diffuse sinus disease. These results were called by telephone at the time of interpretation on 03/01/2022 at 5:48 pm to provider Lita Mains, who verbally acknowledged these results. Electronically Signed   By: San Morelle M.D.   On: 03/01/2022 17:48   DG Forearm Left  Result Date: 03/01/2022 CLINICAL DATA:  Acute LEFT arm pain following fall. EXAM: LEFT FOREARM -  2 VIEW COMPARISON:  None Available. FINDINGS: There is no evidence of fracture or other focal bone lesions. Soft tissues are unremarkable. IMPRESSION: Negative. Electronically Signed   By: Margarette Canada M.D.   On: 03/01/2022 13:23   DG Humerus Left  Result Date: 03/01/2022 CLINICAL DATA:  Fall.  Left humerus pain. EXAM: LEFT HUMERUS - 2+ VIEW COMPARISON:  None Available. FINDINGS: Edema or laceration noted at the elbow. No underlying fracture or foreign body. Humerus is unremarkable. Shoulder is located. IMPRESSION: 1. Edema or laceration at the elbow without underlying fracture or foreign body. 2. No acute osseous abnormality of the left humerus. Electronically Signed   By: San Morelle M.D.   On: 03/01/2022 13:22   CT Maxillofacial Wo Contrast  Result Date: 03/01/2022 CLINICAL DATA:  Facial trauma, blunt. Patient found on the floor unresponsive. Fall recorded. EXAM: CT MAXILLOFACIAL WITHOUT CONTRAST TECHNIQUE: Multidetector CT imaging of the maxillofacial structures was performed. Multiplanar CT image reconstructions were also generated. RADIATION DOSE REDUCTION: This exam was performed according to the departmental dose-optimization program which includes automated exposure control, adjustment of the mA and/or kV according to patient size and/or use of iterative reconstruction technique. COMPARISON:  None Available. FINDINGS: Osseous: No acute fracture is present. The mandible is intact and located. Nasal bones are within normal limits. Orbits: Bilateral lens replacements are noted. Globes and orbits are otherwise unremarkable. Sinuses: Moderate mucosal thickening is present within the left greater than right maxillary sinus. No fluid levels are present. Mild mucosal thickening is present in the left frontal sinus. The paranasal sinuses and mastoid air cells are otherwise clear. Soft tissues: Diffuse soft tissue swelling is present over the left side of the face. Periorbital soft tissue swelling is  present without underlying fracture. No foreign body is present. Stranding extends into the left neck. No focal laceration or fluid collection is present. Limited intracranial: Within normal limits. IMPRESSION: 1. Diffuse soft tissue swelling over the left side of the face without underlying fracture. 2. Periorbital soft tissue swelling without underlying fracture. 3. No foreign body. 4. Moderate mucosal thickening within the left greater than right maxillary sinus. No fluid levels are present. Electronically Signed   By: San Morelle M.D.   On: 03/01/2022 13:05   CT CHEST ABDOMEN PELVIS W CONTRAST  Result Date: 03/01/2022 CLINICAL DATA:  Trauma EXAM: CT CHEST, ABDOMEN, AND PELVIS WITH CONTRAST TECHNIQUE: Multidetector CT imaging of the chest, abdomen and pelvis was performed following the standard protocol during bolus administration of intravenous contrast. RADIATION DOSE REDUCTION: This exam was performed according to the departmental dose-optimization program which includes automated exposure control, adjustment of the mA and/or kV according to patient size and/or use of iterative reconstruction technique. CONTRAST:  17m OMNIPAQUE IOHEXOL 350 MG/ML SOLN COMPARISON:  CT abdomen and pelvis done on 01/10/2015 FINDINGS: CT CHEST FINDINGS Cardiovascular: Coronary artery calcifications are seen. Dense calcification is seen in mitral annulus. Major vascular structures in mediastinum appear intact. Mediastinum/Nodes: There is no mediastinal hematoma. Lungs/Pleura: There is no focal  pulmonary consolidation. There is no pleural effusion or pneumothorax. Few blebs are seen in the apices. Musculoskeletal: No acute findings are seen in bony structures in the thorax. Deformity is seen in the anterolateral aspect of left eleventh rib suggests old fracture. There is deformity in the anterior end of left ninth rib, possibly residual from previous injury. CT ABDOMEN PELVIS FINDINGS Hepatobiliary: There is no  demonstrable focal laceration. There is mild prominence of intrahepatic bile ducts. Gallbladder is not distended. Pancreas: No focal abnormalities are seen. Spleen: Unremarkable. Adrenals/Urinary Tract: Adrenals are unremarkable. There is no hydronephrosis. There is no demonstrable cortical laceration. There is no significant perinephric fluid collection. There are no renal or ureteral stones. Urinary bladder is unremarkable. Stomach/Bowel: Small hiatal hernia is seen. Stomach is unremarkable. Small bowel loops are not dilated. Appendix is not dilated. Multiple diverticula are seen in colon without signs of focal wall thickening. There is no pericolic stranding. Vascular/Lymphatic: There is 5 cm infrarenal abdominal aortic aneurysm which measured 4.1 cm in the previous study. Part of the lumen of aneurysm is filled with thrombus. There is no evidence of retroperitoneal hematoma. Extensive scattered calcifications are seen in aorta and its major branches. Reproductive: Unremarkable. Other: There is no ascites or pneumoperitoneum. Bilateral inguinal hernias containing fat are noted, larger on the left side. Small umbilical hernia containing fat is seen. There is mild stranding in subcutaneous plane adjacent to the left iliac bone, possibly soft tissue contusion without any demonstrable large hematoma. Musculoskeletal: Degenerative changes are noted in sternoclavicular joints, cervical spine, thoracic spine and lumbar spine. There is minimal anterolisthesis at L4-L5 level. There is spinal stenosis and encroachment of neural foramina is seen at multiple levels in the lumbar spine. IMPRESSION: No acute findings are seen in CT scan of chest, abdomen and pelvis. There is no mediastinal or retroperitoneal hematoma. There is no focal pulmonary consolidation. There is no demonstrable laceration in the solid organs. There is no focal bowel wall thickening. There is no ascites or pneumoperitoneum. Coronary artery disease. There  is 5 cm infrarenal abdominal aortic aneurysm which measured 4.1 cm in the study done on 01/10/2015. Recommend follow-up every 6 months and vascular consultation.Reference: J Am Coll Radiol 3267;12:[id number]. Diverticulosis of colon. Other findings as described in the body of the report. Electronically Signed   By: Elmer Picker M.D.   On: 03/01/2022 12:56   CT Cervical Spine Wo Contrast  Result Date: 03/01/2022 CLINICAL DATA:  Neck trauma EXAM: CT CERVICAL SPINE WITHOUT CONTRAST TECHNIQUE: Multidetector CT imaging of the cervical spine was performed without intravenous contrast. Multiplanar CT image reconstructions were also generated. RADIATION DOSE REDUCTION: This exam was performed according to the departmental dose-optimization program which includes automated exposure control, adjustment of the mA and/or kV according to patient size and/or use of iterative reconstruction technique. COMPARISON:  12/05/2015 FINDINGS: Alignment: No traumatic malalignment. Mild C7-T1 anterolisthesis that was also seen previously. Skull base and vertebrae: No acute fracture or aggressive bone lesion. Generalized osteopenia. Soft tissues and spinal canal: Prevertebral and left lateral neck edema in the upper cervical spine. The lateral neck involvement is atypical for a primary cervical spine injury, question direct contusion. Disc levels: Bony fusion from C2-C6. Advanced adjacent segment disc degeneration at C6-7 and facet degeneration at C1-2 especially on the left where there is likely C2 nerve root impingement. Degenerative facet spurring causes mild anterolisthesis. Upper chest: Reported separately IMPRESSION: 1. Prevertebral and left lateral neck edema in the cervical spine without cervical spine fracture or acute malalignment.  If no direct trauma to this area consider cervical MRI to evaluate for soft tissue/ligamentous injury. 2. Cervical spine fusion from C2 to C6. Advanced cervical spine degeneration. Electronically  Signed   By: Jorje Guild M.D.   On: 03/01/2022 12:45   CT HEAD WO CONTRAST (5MM)  Result Date: 03/01/2022 CLINICAL DATA:  Trauma EXAM: CT HEAD WITHOUT CONTRAST TECHNIQUE: Contiguous axial images were obtained from the base of the skull through the vertex without intravenous contrast. RADIATION DOSE REDUCTION: This exam was performed according to the departmental dose-optimization program which includes automated exposure control, adjustment of the mA and/or kV according to patient size and/or use of iterative reconstruction technique. COMPARISON:  03/21/2008 FINDINGS: Brain: No acute intracranial findings are seen. There are no signs of bleeding within the cranium. Cortical sulci are prominent. There is a encephalomalacia in the left frontoparietal cortex, possibly old infarct. There is decreased density in periventricular and subcortical white matter suggesting small-vessel disease. Vascular: Unremarkable. Skull: No fracture is seen in calvarium. There is subcutaneous hematoma in the anterior frontal and left parietal scalp. Sinuses/Orbits: There is mucosal thickening in ethmoid and maxillary sinuses. There are no air-fluid levels. Other: None. IMPRESSION: No acute intracranial findings are seen in noncontrast CT brain. There is subcutaneous hematoma in the frontal and left parietal scalp. No fracture is seen in calvarium. 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 Small-vessel disease. Atrophy. Electronically Signed   By: Elmer Picker M.D.   On: 03/01/2022 12:33   DG Pelvis Portable  Result Date: 03/01/2022 CLINICAL DATA:  Level 1 trauma.  Patient found down. EXAM: PORTABLE PELVIS 1-2 VIEWS COMPARISON:  CT the abdomen and pelvis 01/10/2015 FINDINGS: No acute fractures are present. Hips appear located bilaterally. Degenerative changes are present in the lower lumbar spine. IMPRESSION: 1. No acute fracture or dislocation. 2. Degenerative changes in the lower lumbar spine. Electronically  Signed   By: San Morelle M.D.   On: 03/01/2022 12:00   DG Chest Portable 1 View  Result Date: 03/01/2022 CLINICAL DATA:  Level 1 trauma.  Patient found down. EXAM: PORTABLE CHEST 1 VIEW COMPARISON:  None Available. FINDINGS: The heart size and mediastinal contours are within normal limits. Both lungs are clear. The visualized skeletal structures are unremarkable. IMPRESSION: No active disease. Electronically Signed   By: San Morelle M.D.   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 Will ask neurosurgery to review MRI c-spine.  I reviewed last 24 h vitals and pain scores, last 48 h intake and output, last 24 h labs and trends, and last 24 h imaging results.    LOS: 0 days    [name] [name], [facility] 03/02/2022, 9:59 AM Please see Amion for pager number during day hours 7:00am-4:30pm

## 2022-03-02 NOTE — Progress Notes (Signed)
I was asked to review the patient's MRI scan of his cervical spine.  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  There is no evidence of signal abnormality within the spinal cord itself.  There is some high signal anteriorly to his cervical spine consistent with some strain/injury to his anterior marginal ligament.  As there is no evidence of significant fracturing or obvious instability I think this is reasonably mild and of no significant structural concern.  I think patient may be maintained in a soft cervical collar for comfort.  He definitely requires no thoughts of any surgical intervention.

## 2022-03-02 NOTE — Progress Notes (Signed)
PT Cancellation Note  Patient Details Name: [name] [name] MRN: [id number] DOB: 1940-08-05   Cancelled Treatment:    Reason Eval/Treat Not Completed: Medical issues which prohibited therapy RN reports pt vomited blood within the last hour; there is a palliative care meeting pending today at 11:00   Wyona Almas, PT, [facility] [place] [facility] [place] 03/02/2022, 10:12 AM

## 2022-03-02 NOTE — Consult Note (Signed)
Palliative Medicine Inpatient Consult Note  Consulting Provider: Riesa Pope, MD   Reason for consult:   [facility] Consult  Reason for Consult? Goals of care conversations. Patient has history of denying medications, refusing care. New CVA, multiple remote infarcts, chronic infarcts.   03/02/2022  HPI:  Per intake H&P --> [name] [name] is a 81 y.o. male with PMH of AAA, prior CVA, mixed Alzheimer's and vascular dementia, Afib not on anticoagulation admitted after a fall.  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 Palliative care asked to get involved for goals of care discussions in the setting of overall declining health state.   Clinical Assessment/Goals of Care:  *Please note that this is a verbal dictation therefore any spelling or grammatical errors are due to the "[place] [facility]" system interpretation.  I have reviewed medical records including EPIC notes, labs and imaging, received report from bedside RN, assessed the patient who was noted to have hematemesis on his chin, gown, right cheek, and bed, he was in no distress.    I met with patient's son to him and daughter-in-law [name] [name] this morning to further discuss diagnosis prognosis, GOC, EOL wishes, disposition and options.   I introduced Palliative Medicine as specialized medical care for people living with serious illness. It focuses on providing relief from the symptoms and stress of a serious illness. The goal is to improve quality of life for both the patient and the family.  Medical History Review and Understanding:  A review of [name] [name]'s past medical history inclusive of prior strokes, Alzheimer's and vascular dementia, atrial fibrillation was held.  Social History:  [name] [name] lives in [place].  He was married though had gotten divorced about 12 years ago from his wife. His ex-wife passed away just 2-1/2 months ago.  He had 2 children  though his daughter died of brain cancer about 8 years ago.  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  He is a man of faith and practices within Christianity.  Functional and Nutritional State:  Prior to hospitalization [name] [name] had been living independently able to do his BADLs.  His son shared that he had not fallen regularly and was very much able bodied.  He did have a very good appetite preceding admission.  Advance Directives:  A detailed discussion was had today regarding advanced directives.  Patient's next of kin is his son [name] [name] who is his primary decision maker  Code Status:  Concepts specific to code status, artifical feeding and hydration, continued IV antibiotics and rehospitalization was had.  The difference between a aggressive medical intervention path  and a palliative comfort care path for this patient at this time was had.   [name] [name] is an established DO NOT RESUSCITATE/DO NOT INTUBATE CODE STATUS.  Discussion:  Dr. Alfonse [name] from the internal medicine service was able to join myself in [name] [name] family for more in-depth discussions regarding his present health state.  Updates on recent events such as vomiting and fevers were provided.  Laboratory results were reviewed.  Discussed patient's history of noncompliance with family though they disagree that he is noncompliant and they think he will do many things if he understands the reason behind it.  Patient's family feels he is for the most part misunderstood in the setting of his prior strokes and his gargled speech.  While [name] [name]'s  immediate family understand him and what he is trying to say other specialties do not.  Reviewed the complications on the road ahead and how [name] [name] was a fairly autonomous man also known to be wildly stubborn.  Discussed whether or not he would want to live the life of increased dependency in a facility unable to do for  himself.  Patient's family share that they do not know and this would sound less than favorable.  Further discussed if he should work with PT OT and speech therapy if he is not able to progress would he want artificial nutrition.  Patient's son shares that he just went through this with his mother and it was a miserable experience so at this time he feels less than optimistic about such interventions.  Reviewed that there is a lot which is unknown at this time given that [name] [name] has been here for only a day.  Patient's son would very much like additional workup and medical care to see if [name] [name] can be optimized to the point of some degree of independence/functional gain.  Did discuss the risks of not taking anticoagulants inclusive of recurrent and ongoing strokes.  Patient's son and daughter-in-law are very much aware of the best case and worst-case scenario in the future.  They would like to though see if in the oncoming days there is a degree of improvement which can be made/or alternatively if no improvement is made this would help to guide future decisions.  Patient's son does endorse that he has been through the loss of a loved one with hospice care and thinks highly of their services.  He shares quite honestly that he is hopeful we are not at that point yet for his father.  Discussed the importance of continued conversation with family and their  medical providers regarding overall plan of care and treatment options, ensuring decisions are within the context of the patients values and GOCs.  Decision Maker: [name] [name] (Son):[phone] (Mobile)   SUMMARY OF RECOMMENDATIONS   DNAR/DNI  Plan for additional workup and medical optimization  Conversations started as they relate the realistic long term of patient present situation from a functional/nutritional perspective  Topic of hospice has been broached  Patient's son would like to better understand the evaluations of PT/OT/Speech --> have  requested that he is called in advance to be present during these evaluations  Ongoing palliative support  Code Status/Advance Care Planning: DNAR/DNI  Palliative Prophylaxis:  Aspiration, Bowel Regimen, Delirium Protocol, Frequent Pain Assessment, Oral Care, Palliative Wound Care, and Turn Reposition  Additional Recommendations (Limitations, Scope, Preferences): Continue current care  Psycho-social/Spiritual:  Desire for further Chaplaincy support: Not presently Additional Recommendations: Review of patient's acute on chronic conditions   Prognosis: Guarded.  Discharge Planning: Discharge plan is uncertain at this time.  Vitals:   03/02/22 0041 03/02/22 0443  BP: 104/68 104/63  Pulse: 93 76  Resp: 20 19  Temp: 99.3 F (37.4 C) 100.3 F (37.9 C)  SpO2: 95% 95%    Intake/Output Summary (Last 24 hours) at 03/02/2022 1975 Last data filed at 03/01/2022 2359 Gross per 24 hour  Intake 376.7 ml  Output 150 ml  Net 226.7 ml   Last Weight  Most recent update: 03/01/2022  6:52 PM    Weight  74.1 kg (163 lb 5.8 oz)            Gen: Elderly Caucasian male,  HEENT: C-collar in place, dry mucous membranes CV: Regular rate and irregular rhythm  PULM: On  room air breathing is even and nonlabored ABD: soft/nontender  EXT: No edema  Neuro: Alert to self only  PPS: 10-20% at this time   This conversation/these recommendations were discussed with patient primary care team, internal medicine  Billing based on MDM: High  Problems Addressed: One acute or chronic illness or injury that poses a threat to life or bodily function  Amount and/or Complexity of Data: Category 3:Discussion of management or test interpretation with external physician/other qualified health care professional/appropriate source (not separately reported)  Risks: Parenteral controlled substances, Decision regarding hospitalization or escalation of hospital care, and Decision not to resuscitate or to  de-escalate care because of poor prognosis ______________________________________________________ [facility] Team Team Cell Phone: [phone] Please utilize secure chat with additional questions, if there is no response within 30 minutes please call the above phone number  Palliative Medicine Team providers are available by phone from 7am to 7pm daily and can be reached through the team cell phone.  Should this patient require assistance outside of these hours, please call the patient's attending physician.

## 2022-03-02 NOTE — Progress Notes (Signed)
SLP Cancellation Note  Patient Details Name: [name] [name] MRN: [id number] DOB: 1940-09-01   Cancelled treatment:       Reason Eval/Treat Not Completed: Medical issues which prohibited therapy- RN reports pt vomited blood within the last hour; there is a palliative care meeting pending today at 11:00.  Will f/u later to determine appropriateness of swallow eval.  [name] L. Tivis Ringer, MA CCC/SLP Clinical Specialist - Acute Care [facility] Acute [facility] Services Office number [phone]    [name] 03/02/2022, 8:57 AM

## 2022-03-02 NOTE — H&P (View-Only) (Signed)
UNASSIGNED CONSULT FOR [facility]  Reason for Consult: Hematemesis and anemia Referring Physician: Teaching Service  [name] HPI: This is an 81 year old male with a PMH of GERD, dementia, hyperlipidemia, and afib admitted for a new right MCA CVA.  The patient was noted to have a worsening of his overall function and speech, with a fall.  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  There was a question about medical noncompliance as the reason for the CVA.  Today the patient was noted to have a mild episode of coffee-ground emesis.  His HGB declined from a baseline level of 13 g/dL down to 11.4 g/dL.  Past Medical History:  Diagnosis Date   Arthritis    Cataracts, bilateral    Chickenpox    Dyslipidemia    GERD (gastroesophageal reflux disease)    Rheumatoid arthritis ([place])     Past Surgical History:  Procedure Laterality Date   CATARACT EXTRACTION Bilateral 2011; 2013   COLONOSCOPY WITH PROPOFOL N/A 04/06/2015   Procedure: COLONOSCOPY WITH PROPOFOL;  Surgeon: Josefine Class, MD;  Location: [facility];  Service: Endoscopy;  Laterality: N/A;   TONSILLECTOMY     TRIGGER FINGER RELEASE Left 10/22/12   Thumb    Family History  Problem Relation Age of Onset   Stroke Mother     Social History:  reports that he quit smoking about 33 years ago. He has never used smokeless tobacco. He reports that he does not drink alcohol and does not use drugs.  Allergies: No Known Allergies  Medications: Scheduled:  pantoprazole (PROTONIX) IV  40 mg Intravenous Q12H   Continuous:  sodium chloride 125 mL/hr at 03/02/22 1409   diltiazem (CARDIZEM) infusion 15 mg/hr (03/02/22 1411)    Results for orders placed or performed during the hospital encounter of 03/01/22 (from the past 24 hour(s))  Lipid panel     Status: Abnormal   Collection Time: 03/01/22  6:39 PM  Result Value Ref Range   Cholesterol 169 0 - 200 mg/dL    Triglycerides 67 <150 mg/dL   HDL 37 (L) >40 mg/dL   Total CHOL/HDL Ratio 4.6 RATIO   VLDL 13 0 - 40 mg/dL   LDL Cholesterol 119 (H) 0 - 99 mg/dL  Urine rapid drug screen (hosp performed)     Status: None   Collection Time: 03/01/22  9:31 PM  Result Value Ref Range   Opiates NONE DETECTED NONE DETECTED   Cocaine NONE DETECTED NONE DETECTED   Benzodiazepines NONE DETECTED NONE DETECTED   Amphetamines NONE DETECTED NONE DETECTED   Tetrahydrocannabinol NONE DETECTED NONE DETECTED   Barbiturates NONE DETECTED NONE DETECTED  CBC with Differential/Platelet     Status: Abnormal   Collection Time: 03/02/22  1:19 AM  Result Value Ref Range   WBC 19.3 (H) 4.0 - 10.5 K/uL   RBC 3.59 (L) 4.22 - 5.81 MIL/uL   Hemoglobin 11.7 (L) 13.0 - 17.0 g/dL   HCT 33.3 (L) 39.0 - 52.0 %   MCV 92.8 80.0 - 100.0 fL   MCH 32.6 26.0 - 34.0 pg   MCHC 35.1 30.0 - 36.0 g/dL   RDW 14.0 11.5 - 15.5 %   Platelets 253 150 - 400 K/uL   nRBC 0.0 0.0 - 0.2 %   Neutrophils Relative % 85 %   Neutro Abs 16.4 (H) 1.7 - 7.7 K/uL   Lymphocytes Relative 7 %   Lymphs Abs 1.4 0.7 -  4.0 K/uL   Monocytes Relative 7 %   Monocytes Absolute 1.3 (H) 0.1 - 1.0 K/uL   Eosinophils Relative 0 %   Eosinophils Absolute 0.0 0.0 - 0.5 K/uL   Basophils Relative 0 %   Basophils Absolute 0.0 0.0 - 0.1 K/uL   Immature Granulocytes 1 %   Abs Immature Granulocytes 0.16 (H) 0.00 - 0.07 K/uL  Basic metabolic panel     Status: Abnormal   Collection Time: 03/02/22  1:19 AM  Result Value Ref Range   Sodium 138 135 - 145 mmol/L   Potassium 4.4 3.5 - 5.1 mmol/L   Chloride 109 98 - 111 mmol/L   CO2 20 (L) 22 - 32 mmol/L   Glucose, Bld 118 (H) 70 - 99 mg/dL   BUN 28 (H) 8 - 23 mg/dL   Creatinine, Ser 1.50 (H) 0.61 - 1.24 mg/dL   Calcium 9.3 8.9 - 10.3 mg/dL   GFR, Estimated 46 (L) >60 mL/min   Anion gap 9 5 - 15  Magnesium     Status: None   Collection Time: 03/02/22  1:19 AM  Result Value Ref Range   Magnesium 2.1 1.7 - 2.4 mg/dL   Phosphorus     Status: None   Collection Time: 03/02/22  1:19 AM  Result Value Ref Range   Phosphorus 2.6 2.5 - 4.6 mg/dL  CBC     Status: Abnormal   Collection Time: 03/02/22 10:51 AM  Result Value Ref Range   WBC 18.4 (H) 4.0 - 10.5 K/uL   RBC 3.57 (L) 4.22 - 5.81 MIL/uL   Hemoglobin 11.4 (L) 13.0 - 17.0 g/dL   HCT 34.6 (L) 39.0 - 52.0 %   MCV 96.9 80.0 - 100.0 fL   MCH 31.9 26.0 - 34.0 pg   MCHC 32.9 30.0 - 36.0 g/dL   RDW 13.9 11.5 - 15.5 %   Platelets 229 150 - 400 K/uL   nRBC 0.0 0.0 - 0.2 %     MR Cervical Spine Wo Contrast  Result Date: 03/01/2022 CLINICAL DATA:  Neck trauma. Ligament injury suspected. Patient was found down. EXAM: MRI CERVICAL SPINE WITHOUT CONTRAST TECHNIQUE: Multiplanar, multisequence MR imaging of the cervical spine was performed. No intravenous contrast was administered. COMPARISON:  CT of the cervical spine without contrast 03/01/2022 FINDINGS: Alignment: Slight anterolisthesis at C7-T1 and reversal of the upper cervical lordosis is stable. Vertebrae: Marrow signal and vertebral body heights in the cervical spine are normal. Diffuse decreased marrow signal is present in the skull. No discrete lesions are present. Cord: Normal signal and morphology. Posterior Fossa, vertebral arteries, paraspinal tissues: Craniocervical junction is within normal limits. Prevertebral fluid is evident from the skull base through C6-7. No adjacent bone edema is present. Minimal edema is present along the posterior spinous ligament at the C4-5 level more inferiorly on the left into the paraspinous soft tissues. Disc levels: C2-3: Negative. C3-4: Asymmetric left-sided uncovertebral and facet hypertrophy present. Moderate left and mild right foraminal narrowing is present. C4-5: A broad-based disc osteophyte complex effaces the ventral CSF. Moderate foraminal narrowing is worse on the left. C5-6: Shallow central disc protrusion partially effaces the ventral CSF. The foramina are patent.  C6-7: A broad-based disc osteophyte complex present. Uncovertebral spurring results in moderate foraminal narrowing, right greater than left. C7-T1: Mild facet hypertrophy is present bilaterally. Uncovering of a broad-based disc protrusion is present. No significant stenosis is present. IMPRESSION: 1. Prevertebral fluid from the skull base through C6-7 without adjacent bone edema. This is  nonspecific, but can be seen in the setting of acute trauma suggesting anterior ligamentous injury. No associated fracture. 2. Minimal edema along the posterior spinous ligament at the C4-5 level more inferiorly on the left into the paraspinous soft tissues. 3. Moderate left and mild right foraminal narrowing at C3-4. 4. Moderate foraminal narrowing bilaterally at C4-5 is worse on the left. 5. Moderate foraminal narrowing bilaterally at C6-7 is right greater than left. 6. Mild facet hypertrophy and a broad-based disc protrusion at C7-T1 without significant stenosis. Electronically Signed   By: San Morelle M.D.   On: 03/01/2022 17:54   MR BRAIN WO CONTRAST  Result Date: 03/01/2022 CLINICAL DATA:  Head trauma.  Patient found down. EXAM: MRI HEAD WITHOUT CONTRAST TECHNIQUE: Multiplanar, multiecho pulse sequences of the brain and surrounding structures were obtained without intravenous contrast. COMPARISON:  CT head without contrast 03/01/2022 FINDINGS: Brain: The diffusion-weighted images demonstrate no acute right MCA territory infarct. The infarct involves the right caudate head internal capsule and lentiform nucleus. The insular cortex is spared. Cortical and subcortical involvement of the right parietal and temporal lobe is present. Posterior right frontal lobe is involved. Areas of susceptibility are present within the posterior right lentiform nucleus and caudate head consistent with petechial hemorrhage. Generalized atrophy and white matter disease is otherwise stable. No significant extraaxial fluid collection is  present. Remote lacunar infarcts are present within the left cerebellum. Brainstem and cerebellum are otherwise within normal limits. Vascular: Flow is present in the major intracranial arteries. Skull and upper cervical spine: The craniocervical junction is normal. Upper cervical spine is within normal limits. Degenerative changes are present in the upper cervical spine. Craniocervical junction is normal. Marrow signal in the skull is diffusely depressed. No discrete lesions are evident. Sinuses/Orbits: Circumferential mucosal thickening is present in the maxillary sinuses bilaterally. No fluid levels are present. Scattered mucosal thickening is present throughout the ethmoid air cells and left frontal sinus. Mastoid air cells are clear. Bilateral lens replacements are noted. Globes and orbits are otherwise unremarkable. IMPRESSION: 1. Acute/subacute right MCA territory infarct involving the right caudate head internal capsule and lentiform nucleus as well as more diffusely over the right temporal and parietal lobe as well as the posterior right frontal lobe. 2. Areas of susceptibility within the posterior right lentiform nucleus and caudate head consistent with petechial hemorrhage. 3. Generalized atrophy and white matter disease is otherwise stable. This likely reflects the sequela of chronic microvascular ischemia. 4. Remote lacunar infarcts of the left cerebellum. 5. Diffuse sinus disease. These results were called by telephone at the time of interpretation on 03/01/2022 at 5:48 pm to provider Lita Mains, who verbally acknowledged these results. Electronically Signed   By: San Morelle M.D.   On: 03/01/2022 17:48   DG Forearm Left  Result Date: 03/01/2022 CLINICAL DATA:  Acute LEFT arm pain following fall. EXAM: LEFT FOREARM - 2 VIEW COMPARISON:  None Available. FINDINGS: There is no evidence of fracture or other focal bone lesions. Soft tissues are unremarkable. IMPRESSION: Negative.  Electronically Signed   By: Margarette Canada M.D.   On: 03/01/2022 13:23   DG Humerus Left  Result Date: 03/01/2022 CLINICAL DATA:  Fall.  Left humerus pain. EXAM: LEFT HUMERUS - 2+ VIEW COMPARISON:  None Available. FINDINGS: Edema or laceration noted at the elbow. No underlying fracture or foreign body. Humerus is unremarkable. Shoulder is located. IMPRESSION: 1. Edema or laceration at the elbow without underlying fracture or foreign body. 2. No acute osseous abnormality of the  left humerus. Electronically Signed   By: San Morelle M.D.   On: 03/01/2022 13:22   CT Maxillofacial Wo Contrast  Result Date: 03/01/2022 CLINICAL DATA:  Facial trauma, blunt. Patient found on the floor unresponsive. Fall recorded. EXAM: CT MAXILLOFACIAL WITHOUT CONTRAST TECHNIQUE: Multidetector CT imaging of the maxillofacial structures was performed. Multiplanar CT image reconstructions were also generated. RADIATION DOSE REDUCTION: This exam was performed according to the departmental dose-optimization program which includes automated exposure control, adjustment of the mA and/or kV according to patient size and/or use of iterative reconstruction technique. COMPARISON:  None Available. FINDINGS: Osseous: No acute fracture is present. The mandible is intact and located. Nasal bones are within normal limits. Orbits: Bilateral lens replacements are noted. Globes and orbits are otherwise unremarkable. Sinuses: Moderate mucosal thickening is present within the left greater than right maxillary sinus. No fluid levels are present. Mild mucosal thickening is present in the left frontal sinus. The paranasal sinuses and mastoid air cells are otherwise clear. Soft tissues: Diffuse soft tissue swelling is present over the left side of the face. Periorbital soft tissue swelling is present without underlying fracture. No foreign body is present. Stranding extends into the left neck. No focal laceration or fluid collection is present.  Limited intracranial: Within normal limits. IMPRESSION: 1. Diffuse soft tissue swelling over the left side of the face without underlying fracture. 2. Periorbital soft tissue swelling without underlying fracture. 3. No foreign body. 4. Moderate mucosal thickening within the left greater than right maxillary sinus. No fluid levels are present. Electronically Signed   By: San Morelle M.D.   On: 03/01/2022 13:05   CT CHEST ABDOMEN PELVIS W CONTRAST  Result Date: 03/01/2022 CLINICAL DATA:  Trauma EXAM: CT CHEST, ABDOMEN, AND PELVIS WITH CONTRAST TECHNIQUE: Multidetector CT imaging of the chest, abdomen and pelvis was performed following the standard protocol during bolus administration of intravenous contrast. RADIATION DOSE REDUCTION: This exam was performed according to the departmental dose-optimization program which includes automated exposure control, adjustment of the mA and/or kV according to patient size and/or use of iterative reconstruction technique. CONTRAST:  42m OMNIPAQUE IOHEXOL 350 MG/ML SOLN COMPARISON:  CT abdomen and pelvis done on 01/10/2015 FINDINGS: CT CHEST FINDINGS Cardiovascular: Coronary artery calcifications are seen. Dense calcification is seen in mitral annulus. Major vascular structures in mediastinum appear intact. Mediastinum/Nodes: There is no mediastinal hematoma. Lungs/Pleura: There is no focal pulmonary consolidation. There is no pleural effusion or pneumothorax. Few blebs are seen in the apices. Musculoskeletal: No acute findings are seen in bony structures in the thorax. Deformity is seen in the anterolateral aspect of left eleventh rib suggests old fracture. There is deformity in the anterior end of left ninth rib, possibly residual from previous injury. CT ABDOMEN PELVIS FINDINGS Hepatobiliary: There is no demonstrable focal laceration. There is mild prominence of intrahepatic bile ducts. Gallbladder is not distended. Pancreas: No focal abnormalities are seen.  Spleen: Unremarkable. Adrenals/Urinary Tract: Adrenals are unremarkable. There is no hydronephrosis. There is no demonstrable cortical laceration. There is no significant perinephric fluid collection. There are no renal or ureteral stones. Urinary bladder is unremarkable. Stomach/Bowel: Small hiatal hernia is seen. Stomach is unremarkable. Small bowel loops are not dilated. Appendix is not dilated. Multiple diverticula are seen in colon without signs of focal wall thickening. There is no pericolic stranding. Vascular/Lymphatic: There is 5 cm infrarenal abdominal aortic aneurysm which measured 4.1 cm in the previous study. Part of the lumen of aneurysm is filled with thrombus. There is no evidence  of retroperitoneal hematoma. Extensive scattered calcifications are seen in aorta and its major branches. Reproductive: Unremarkable. Other: There is no ascites or pneumoperitoneum. Bilateral inguinal hernias containing fat are noted, larger on the left side. Small umbilical hernia containing fat is seen. There is mild stranding in subcutaneous plane adjacent to the left iliac bone, possibly soft tissue contusion without any demonstrable large hematoma. Musculoskeletal: Degenerative changes are noted in sternoclavicular joints, cervical spine, thoracic spine and lumbar spine. There is minimal anterolisthesis at L4-L5 level. There is spinal stenosis and encroachment of neural foramina is seen at multiple levels in the lumbar spine. IMPRESSION: No acute findings are seen in CT scan of chest, abdomen and pelvis. There is no mediastinal or retroperitoneal hematoma. There is no focal pulmonary consolidation. There is no demonstrable laceration in the solid organs. There is no focal bowel wall thickening. There is no ascites or pneumoperitoneum. Coronary artery disease. There is 5 cm infrarenal abdominal aortic aneurysm which measured 4.1 cm in the study done on 01/10/2015. Recommend follow-up every 6 months and vascular  consultation.Reference: J Am Coll Radiol 4818;[id number]. Diverticulosis of colon. Other findings as described in the body of the report. Electronically Signed   By: Elmer Picker M.D.   On: 03/01/2022 12:56   CT Cervical Spine Wo Contrast  Result Date: 03/01/2022 CLINICAL DATA:  Neck trauma EXAM: CT CERVICAL SPINE WITHOUT CONTRAST TECHNIQUE: Multidetector CT imaging of the cervical spine was performed without intravenous contrast. Multiplanar CT image reconstructions were also generated. RADIATION DOSE REDUCTION: This exam was performed according to the departmental dose-optimization program which includes automated exposure control, adjustment of the mA and/or kV according to patient size and/or use of iterative reconstruction technique. COMPARISON:  12/05/2015 FINDINGS: Alignment: No traumatic malalignment. Mild C7-T1 anterolisthesis that was also seen previously. Skull base and vertebrae: No acute fracture or aggressive bone lesion. Generalized osteopenia. Soft tissues and spinal canal: Prevertebral and left lateral neck edema in the upper cervical spine. The lateral neck involvement is atypical for a primary cervical spine injury, question direct contusion. Disc levels: Bony fusion from C2-C6. Advanced adjacent segment disc degeneration at C6-7 and facet degeneration at C1-2 especially on the left where there is likely C2 nerve root impingement. Degenerative facet spurring causes mild anterolisthesis. Upper chest: Reported separately IMPRESSION: 1. Prevertebral and left lateral neck edema in the cervical spine without cervical spine fracture or acute malalignment. If no direct trauma to this area consider cervical MRI to evaluate for soft tissue/ligamentous injury. 2. Cervical spine fusion from C2 to C6. Advanced cervical spine degeneration. Electronically Signed   By: Jorje Guild M.D.   On: 03/01/2022 12:45   CT HEAD WO CONTRAST (5MM)  Result Date: 03/01/2022 CLINICAL DATA:  Trauma EXAM: CT  HEAD WITHOUT CONTRAST TECHNIQUE: Contiguous axial images were obtained from the base of the skull through the vertex without intravenous contrast. RADIATION DOSE REDUCTION: This exam was performed according to the departmental dose-optimization program which includes automated exposure control, adjustment of the mA and/or kV according to patient size and/or use of iterative reconstruction technique. COMPARISON:  03/21/2008 FINDINGS: Brain: No acute intracranial findings are seen. There are no signs of bleeding within the cranium. Cortical sulci are prominent. There is a encephalomalacia in the left frontoparietal cortex, possibly old infarct. There is decreased density in periventricular and subcortical white matter suggesting small-vessel disease. Vascular: Unremarkable. Skull: No fracture is seen in calvarium. There is subcutaneous hematoma in the anterior frontal and left parietal scalp. Sinuses/Orbits: There is mucosal thickening  in ethmoid and maxillary sinuses. There are no air-fluid levels. Other: None. IMPRESSION: No acute intracranial findings are seen in noncontrast CT brain. There is subcutaneous hematoma in the frontal and left parietal scalp. No fracture is seen in calvarium. 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 Small-vessel disease. Atrophy. Electronically Signed   By: Elmer Picker M.D.   On: 03/01/2022 12:33   DG Pelvis Portable  Result Date: 03/01/2022 CLINICAL DATA:  Level 1 trauma.  Patient found down. EXAM: PORTABLE PELVIS 1-2 VIEWS COMPARISON:  CT the abdomen and pelvis 01/10/2015 FINDINGS: No acute fractures are present. Hips appear located bilaterally. Degenerative changes are present in the lower lumbar spine. IMPRESSION: 1. No acute fracture or dislocation. 2. Degenerative changes in the lower lumbar spine. Electronically Signed   By: San Morelle M.D.   On: 03/01/2022 12:00   DG Chest Portable 1 View  Result Date: 03/01/2022 CLINICAL DATA:  Level 1 trauma.   Patient found down. EXAM: PORTABLE CHEST 1 VIEW COMPARISON:  None Available. FINDINGS: The heart size and mediastinal contours are within normal limits. Both lungs are clear. The visualized skeletal structures are unremarkable. IMPRESSION: No active disease. Electronically Signed   By: San Morelle M.D.   On: 03/01/2022 11:59    ROS:  As stated above in the HPI otherwise negative.  Blood pressure 98/67, pulse 76, temperature 98.9 F (37.2 C), temperature source Axillary, resp. rate 20, height '5\' 11"'$  (1.803 m), weight 74.1 kg, SpO2 97 %.    PE: Gen: Sleeping, difficult to arouse. Neck: Neck collar Lungs: CTA Bilaterally CV: RRR without M/G/R ABD: Soft, NTND, +BS Ext: No C/C/E  Assessment/Plan: 1) Coffee-ground emesis. 2) Anemia. 3) MCV CVA. 4) Afib. 5) Dementia.   Further evaluation will be performed with an EGD.  A likely candidate for the coffee-ground emesis is an esophagitis.  Plan: 1) EGD tomorrow with Dr. Silverio Decamp. 2) Agree with PPI. 3) Monitor HGB and transfuse as necessary.  Semira Stoltzfus D 03/02/2022, 2:47 PM

## 2022-03-02 NOTE — Consult Note (Signed)
UNASSIGNED CONSULT FOR [facility]  Reason for Consult: Hematemesis and anemia Referring Physician: Teaching Service  [name] [name] HPI: This is an 81 year old male with a PMH of GERD, dementia, hyperlipidemia, and afib admitted for a new right MCA CVA.  The patient was noted to have a worsening of his overall function and speech, with a fall.  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  There was a question about medical noncompliance as the reason for the CVA.  Today the patient was noted to have a mild episode of coffee-ground emesis.  His HGB declined from a baseline level of 13 g/dL down to 11.4 g/dL.  Past Medical History:  Diagnosis Date   Arthritis    Cataracts, bilateral    Chickenpox    Dyslipidemia    GERD (gastroesophageal reflux disease)    Rheumatoid arthritis ([place])     Past Surgical History:  Procedure Laterality Date   CATARACT EXTRACTION Bilateral 2011; 2013   COLONOSCOPY WITH PROPOFOL N/A 04/06/2015   Procedure: COLONOSCOPY WITH PROPOFOL;  Surgeon: Josefine Class, MD;  Location: [facility] ENDOSCOPY;  Service: Endoscopy;  Laterality: N/A;   TONSILLECTOMY     TRIGGER FINGER RELEASE Left 10/22/12   Thumb    Family History  Problem Relation Age of Onset   Stroke Mother     Social History:  reports that he quit smoking about 33 years ago. He has never used smokeless tobacco. He reports that he does not drink alcohol and does not use drugs.  Allergies: No Known Allergies  Medications: Scheduled:  pantoprazole (PROTONIX) IV  40 mg Intravenous Q12H   Continuous:  sodium chloride 125 mL/hr at 03/02/22 1409   diltiazem (CARDIZEM) infusion 15 mg/hr (03/02/22 1411)    Results for orders placed or performed during the hospital encounter of 03/01/22 (from the past 24 hour(s))  Lipid panel     Status: Abnormal   Collection Time: 03/01/22  6:39 PM  Result Value Ref Range   Cholesterol 169 0 - 200 mg/dL    Triglycerides 67 <150 mg/dL   HDL 37 (L) >40 mg/dL   Total CHOL/HDL Ratio 4.6 RATIO   VLDL 13 0 - 40 mg/dL   LDL Cholesterol 119 (H) 0 - 99 mg/dL  Urine rapid drug screen (hosp performed)     Status: None   Collection Time: 03/01/22  9:31 PM  Result Value Ref Range   Opiates NONE DETECTED NONE DETECTED   Cocaine NONE DETECTED NONE DETECTED   Benzodiazepines NONE DETECTED NONE DETECTED   Amphetamines NONE DETECTED NONE DETECTED   Tetrahydrocannabinol NONE DETECTED NONE DETECTED   Barbiturates NONE DETECTED NONE DETECTED  CBC with Differential/Platelet     Status: Abnormal   Collection Time: 03/02/22  1:19 AM  Result Value Ref Range   WBC 19.3 (H) 4.0 - 10.5 K/uL   RBC 3.59 (L) 4.22 - 5.81 MIL/uL   Hemoglobin 11.7 (L) 13.0 - 17.0 g/dL   HCT 33.3 (L) 39.0 - 52.0 %   MCV 92.8 80.0 - 100.0 fL   MCH 32.6 26.0 - 34.0 pg   MCHC 35.1 30.0 - 36.0 g/dL   RDW 14.0 11.5 - 15.5 %   Platelets 253 150 - 400 K/uL   nRBC 0.0 0.0 - 0.2 %   Neutrophils Relative % 85 %   Neutro Abs 16.4 (H) 1.7 - 7.7 K/uL   Lymphocytes Relative 7 %   Lymphs Abs 1.4 0.7 -  4.0 K/uL   Monocytes Relative 7 %   Monocytes Absolute 1.3 (H) 0.1 - 1.0 K/uL   Eosinophils Relative 0 %   Eosinophils Absolute 0.0 0.0 - 0.5 K/uL   Basophils Relative 0 %   Basophils Absolute 0.0 0.0 - 0.1 K/uL   Immature Granulocytes 1 %   Abs Immature Granulocytes 0.16 (H) 0.00 - 0.07 K/uL  Basic metabolic panel     Status: Abnormal   Collection Time: 03/02/22  1:19 AM  Result Value Ref Range   Sodium 138 135 - 145 mmol/L   Potassium 4.4 3.5 - 5.1 mmol/L   Chloride 109 98 - 111 mmol/L   CO2 20 (L) 22 - 32 mmol/L   Glucose, Bld 118 (H) 70 - 99 mg/dL   BUN 28 (H) 8 - 23 mg/dL   Creatinine, Ser 1.50 (H) 0.61 - 1.24 mg/dL   Calcium 9.3 8.9 - 10.3 mg/dL   GFR, Estimated 46 (L) >60 mL/min   Anion gap 9 5 - 15  Magnesium     Status: None   Collection Time: 03/02/22  1:19 AM  Result Value Ref Range   Magnesium 2.1 1.7 - 2.4 mg/dL   Phosphorus     Status: None   Collection Time: 03/02/22  1:19 AM  Result Value Ref Range   Phosphorus 2.6 2.5 - 4.6 mg/dL  CBC     Status: Abnormal   Collection Time: 03/02/22 10:51 AM  Result Value Ref Range   WBC 18.4 (H) 4.0 - 10.5 K/uL   RBC 3.57 (L) 4.22 - 5.81 MIL/uL   Hemoglobin 11.4 (L) 13.0 - 17.0 g/dL   HCT 34.6 (L) 39.0 - 52.0 %   MCV 96.9 80.0 - 100.0 fL   MCH 31.9 26.0 - 34.0 pg   MCHC 32.9 30.0 - 36.0 g/dL   RDW 13.9 11.5 - 15.5 %   Platelets 229 150 - 400 K/uL   nRBC 0.0 0.0 - 0.2 %     MR Cervical Spine Wo Contrast  Result Date: 03/01/2022 CLINICAL DATA:  Neck trauma. Ligament injury suspected. Patient was found down. EXAM: MRI CERVICAL SPINE WITHOUT CONTRAST TECHNIQUE: Multiplanar, multisequence MR imaging of the cervical spine was performed. No intravenous contrast was administered. COMPARISON:  CT of the cervical spine without contrast 03/01/2022 FINDINGS: Alignment: Slight anterolisthesis at C7-T1 and reversal of the upper cervical lordosis is stable. Vertebrae: Marrow signal and vertebral body heights in the cervical spine are normal. Diffuse decreased marrow signal is present in the skull. No discrete lesions are present. Cord: Normal signal and morphology. Posterior Fossa, vertebral arteries, paraspinal tissues: Craniocervical junction is within normal limits. Prevertebral fluid is evident from the skull base through C6-7. No adjacent bone edema is present. Minimal edema is present along the posterior spinous ligament at the C4-5 level more inferiorly on the left into the paraspinous soft tissues. Disc levels: C2-3: Negative. C3-4: Asymmetric left-sided uncovertebral and facet hypertrophy present. Moderate left and mild right foraminal narrowing is present. C4-5: A broad-based disc osteophyte complex effaces the ventral CSF. Moderate foraminal narrowing is worse on the left. C5-6: Shallow central disc protrusion partially effaces the ventral CSF. The foramina are patent.  C6-7: A broad-based disc osteophyte complex present. Uncovertebral spurring results in moderate foraminal narrowing, right greater than left. C7-T1: Mild facet hypertrophy is present bilaterally. Uncovering of a broad-based disc protrusion is present. No significant stenosis is present. IMPRESSION: 1. Prevertebral fluid from the skull base through C6-7 without adjacent bone edema. This is  nonspecific, but can be seen in the setting of acute trauma suggesting anterior ligamentous injury. No associated fracture. 2. Minimal edema along the posterior spinous ligament at the C4-5 level more inferiorly on the left into the paraspinous soft tissues. 3. Moderate left and mild right foraminal narrowing at C3-4. 4. Moderate foraminal narrowing bilaterally at C4-5 is worse on the left. 5. Moderate foraminal narrowing bilaterally at C6-7 is right greater than left. 6. Mild facet hypertrophy and a broad-based disc protrusion at C7-T1 without significant stenosis. Electronically Signed   By: San Morelle M.D.   On: 03/01/2022 17:54   MR BRAIN WO CONTRAST  Result Date: 03/01/2022 CLINICAL DATA:  Head trauma.  Patient found down. EXAM: MRI HEAD WITHOUT CONTRAST TECHNIQUE: Multiplanar, multiecho pulse sequences of the brain and surrounding structures were obtained without intravenous contrast. COMPARISON:  CT head without contrast 03/01/2022 FINDINGS: Brain: The diffusion-weighted images demonstrate no acute right MCA territory infarct. The infarct involves the right caudate head internal capsule and lentiform nucleus. The insular cortex is spared. Cortical and subcortical involvement of the right parietal and temporal lobe is present. Posterior right frontal lobe is involved. Areas of susceptibility are present within the posterior right lentiform nucleus and caudate head consistent with petechial hemorrhage. Generalized atrophy and white matter disease is otherwise stable. No significant extraaxial fluid collection is  present. Remote lacunar infarcts are present within the left cerebellum. Brainstem and cerebellum are otherwise within normal limits. Vascular: Flow is present in the major intracranial arteries. Skull and upper cervical spine: The craniocervical junction is normal. Upper cervical spine is within normal limits. Degenerative changes are present in the upper cervical spine. Craniocervical junction is normal. Marrow signal in the skull is diffusely depressed. No discrete lesions are evident. Sinuses/Orbits: Circumferential mucosal thickening is present in the maxillary sinuses bilaterally. No fluid levels are present. Scattered mucosal thickening is present throughout the ethmoid air cells and left frontal sinus. Mastoid air cells are clear. Bilateral lens replacements are noted. Globes and orbits are otherwise unremarkable. IMPRESSION: 1. Acute/subacute right MCA territory infarct involving the right caudate head internal capsule and lentiform nucleus as well as more diffusely over the right temporal and parietal lobe as well as the posterior right frontal lobe. 2. Areas of susceptibility within the posterior right lentiform nucleus and caudate head consistent with petechial hemorrhage. 3. Generalized atrophy and white matter disease is otherwise stable. This likely reflects the sequela of chronic microvascular ischemia. 4. Remote lacunar infarcts of the left cerebellum. 5. Diffuse sinus disease. These results were called by telephone at the time of interpretation on 03/01/2022 at 5:48 pm to provider Lita Mains, who verbally acknowledged these results. Electronically Signed   By: San Morelle M.D.   On: 03/01/2022 17:48   DG Forearm Left  Result Date: 03/01/2022 CLINICAL DATA:  Acute LEFT arm pain following fall. EXAM: LEFT FOREARM - 2 VIEW COMPARISON:  None Available. FINDINGS: There is no evidence of fracture or other focal bone lesions. Soft tissues are unremarkable. IMPRESSION: Negative.  Electronically Signed   By: Margarette Canada M.D.   On: 03/01/2022 13:23   DG Humerus Left  Result Date: 03/01/2022 CLINICAL DATA:  Fall.  Left humerus pain. EXAM: LEFT HUMERUS - 2+ VIEW COMPARISON:  None Available. FINDINGS: Edema or laceration noted at the elbow. No underlying fracture or foreign body. Humerus is unremarkable. Shoulder is located. IMPRESSION: 1. Edema or laceration at the elbow without underlying fracture or foreign body. 2. No acute osseous abnormality of the  left humerus. Electronically Signed   By: San Morelle M.D.   On: 03/01/2022 13:22   CT Maxillofacial Wo Contrast  Result Date: 03/01/2022 CLINICAL DATA:  Facial trauma, blunt. Patient found on the floor unresponsive. Fall recorded. EXAM: CT MAXILLOFACIAL WITHOUT CONTRAST TECHNIQUE: Multidetector CT imaging of the maxillofacial structures was performed. Multiplanar CT image reconstructions were also generated. RADIATION DOSE REDUCTION: This exam was performed according to the departmental dose-optimization program which includes automated exposure control, adjustment of the mA and/or kV according to patient size and/or use of iterative reconstruction technique. COMPARISON:  None Available. FINDINGS: Osseous: No acute fracture is present. The mandible is intact and located. Nasal bones are within normal limits. Orbits: Bilateral lens replacements are noted. Globes and orbits are otherwise unremarkable. Sinuses: Moderate mucosal thickening is present within the left greater than right maxillary sinus. No fluid levels are present. Mild mucosal thickening is present in the left frontal sinus. The paranasal sinuses and mastoid air cells are otherwise clear. Soft tissues: Diffuse soft tissue swelling is present over the left side of the face. Periorbital soft tissue swelling is present without underlying fracture. No foreign body is present. Stranding extends into the left neck. No focal laceration or fluid collection is present.  Limited intracranial: Within normal limits. IMPRESSION: 1. Diffuse soft tissue swelling over the left side of the face without underlying fracture. 2. Periorbital soft tissue swelling without underlying fracture. 3. No foreign body. 4. Moderate mucosal thickening within the left greater than right maxillary sinus. No fluid levels are present. Electronically Signed   By: San Morelle M.D.   On: 03/01/2022 13:05   CT CHEST ABDOMEN PELVIS W CONTRAST  Result Date: 03/01/2022 CLINICAL DATA:  Trauma EXAM: CT CHEST, ABDOMEN, AND PELVIS WITH CONTRAST TECHNIQUE: Multidetector CT imaging of the chest, abdomen and pelvis was performed following the standard protocol during bolus administration of intravenous contrast. RADIATION DOSE REDUCTION: This exam was performed according to the departmental dose-optimization program which includes automated exposure control, adjustment of the mA and/or kV according to patient size and/or use of iterative reconstruction technique. CONTRAST:  22m OMNIPAQUE IOHEXOL 350 MG/ML SOLN COMPARISON:  CT abdomen and pelvis done on 01/10/2015 FINDINGS: CT CHEST FINDINGS Cardiovascular: Coronary artery calcifications are seen. Dense calcification is seen in mitral annulus. Major vascular structures in mediastinum appear intact. Mediastinum/Nodes: There is no mediastinal hematoma. Lungs/Pleura: There is no focal pulmonary consolidation. There is no pleural effusion or pneumothorax. Few blebs are seen in the apices. Musculoskeletal: No acute findings are seen in bony structures in the thorax. Deformity is seen in the anterolateral aspect of left eleventh rib suggests old fracture. There is deformity in the anterior end of left ninth rib, possibly residual from previous injury. CT ABDOMEN PELVIS FINDINGS Hepatobiliary: There is no demonstrable focal laceration. There is mild prominence of intrahepatic bile ducts. Gallbladder is not distended. Pancreas: No focal abnormalities are seen.  Spleen: Unremarkable. Adrenals/Urinary Tract: Adrenals are unremarkable. There is no hydronephrosis. There is no demonstrable cortical laceration. There is no significant perinephric fluid collection. There are no renal or ureteral stones. Urinary bladder is unremarkable. Stomach/Bowel: Small hiatal hernia is seen. Stomach is unremarkable. Small bowel loops are not dilated. Appendix is not dilated. Multiple diverticula are seen in colon without signs of focal wall thickening. There is no pericolic stranding. Vascular/Lymphatic: There is 5 cm infrarenal abdominal aortic aneurysm which measured 4.1 cm in the previous study. Part of the lumen of aneurysm is filled with thrombus. There is no evidence  of retroperitoneal hematoma. Extensive scattered calcifications are seen in aorta and its major branches. Reproductive: Unremarkable. Other: There is no ascites or pneumoperitoneum. Bilateral inguinal hernias containing fat are noted, larger on the left side. Small umbilical hernia containing fat is seen. There is mild stranding in subcutaneous plane adjacent to the left iliac bone, possibly soft tissue contusion without any demonstrable large hematoma. Musculoskeletal: Degenerative changes are noted in sternoclavicular joints, cervical spine, thoracic spine and lumbar spine. There is minimal anterolisthesis at L4-L5 level. There is spinal stenosis and encroachment of neural foramina is seen at multiple levels in the lumbar spine. IMPRESSION: No acute findings are seen in CT scan of chest, abdomen and pelvis. There is no mediastinal or retroperitoneal hematoma. There is no focal pulmonary consolidation. There is no demonstrable laceration in the solid organs. There is no focal bowel wall thickening. There is no ascites or pneumoperitoneum. Coronary artery disease. There is 5 cm infrarenal abdominal aortic aneurysm which measured 4.1 cm in the study done on 01/10/2015. Recommend follow-up every 6 months and vascular  consultation.Reference: J Am Coll Radiol 9147;[id number]. Diverticulosis of colon. Other findings as described in the body of the report. Electronically Signed   By: Elmer Picker M.D.   On: 03/01/2022 12:56   CT Cervical Spine Wo Contrast  Result Date: 03/01/2022 CLINICAL DATA:  Neck trauma EXAM: CT CERVICAL SPINE WITHOUT CONTRAST TECHNIQUE: Multidetector CT imaging of the cervical spine was performed without intravenous contrast. Multiplanar CT image reconstructions were also generated. RADIATION DOSE REDUCTION: This exam was performed according to the departmental dose-optimization program which includes automated exposure control, adjustment of the mA and/or kV according to patient size and/or use of iterative reconstruction technique. COMPARISON:  12/05/2015 FINDINGS: Alignment: No traumatic malalignment. Mild C7-T1 anterolisthesis that was also seen previously. Skull base and vertebrae: No acute fracture or aggressive bone lesion. Generalized osteopenia. Soft tissues and spinal canal: Prevertebral and left lateral neck edema in the upper cervical spine. The lateral neck involvement is atypical for a primary cervical spine injury, question direct contusion. Disc levels: Bony fusion from C2-C6. Advanced adjacent segment disc degeneration at C6-7 and facet degeneration at C1-2 especially on the left where there is likely C2 nerve root impingement. Degenerative facet spurring causes mild anterolisthesis. Upper chest: Reported separately IMPRESSION: 1. Prevertebral and left lateral neck edema in the cervical spine without cervical spine fracture or acute malalignment. If no direct trauma to this area consider cervical MRI to evaluate for soft tissue/ligamentous injury. 2. Cervical spine fusion from C2 to C6. Advanced cervical spine degeneration. Electronically Signed   By: Jorje Guild M.D.   On: 03/01/2022 12:45   CT HEAD WO CONTRAST (5MM)  Result Date: 03/01/2022 CLINICAL DATA:  Trauma EXAM: CT  HEAD WITHOUT CONTRAST TECHNIQUE: Contiguous axial images were obtained from the base of the skull through the vertex without intravenous contrast. RADIATION DOSE REDUCTION: This exam was performed according to the departmental dose-optimization program which includes automated exposure control, adjustment of the mA and/or kV according to patient size and/or use of iterative reconstruction technique. COMPARISON:  03/21/2008 FINDINGS: Brain: No acute intracranial findings are seen. There are no signs of bleeding within the cranium. Cortical sulci are prominent. There is a encephalomalacia in the left frontoparietal cortex, possibly old infarct. There is decreased density in periventricular and subcortical white matter suggesting small-vessel disease. Vascular: Unremarkable. Skull: No fracture is seen in calvarium. There is subcutaneous hematoma in the anterior frontal and left parietal scalp. Sinuses/Orbits: There is mucosal thickening  in ethmoid and maxillary sinuses. There are no air-fluid levels. Other: None. IMPRESSION: No acute intracranial findings are seen in noncontrast CT brain. There is subcutaneous hematoma in the frontal and left parietal scalp. No fracture is seen in calvarium. 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 Small-vessel disease. Atrophy. Electronically Signed   By: Elmer Picker M.D.   On: 03/01/2022 12:33   DG Pelvis Portable  Result Date: 03/01/2022 CLINICAL DATA:  Level 1 trauma.  Patient found down. EXAM: PORTABLE PELVIS 1-2 VIEWS COMPARISON:  CT the abdomen and pelvis 01/10/2015 FINDINGS: No acute fractures are present. Hips appear located bilaterally. Degenerative changes are present in the lower lumbar spine. IMPRESSION: 1. No acute fracture or dislocation. 2. Degenerative changes in the lower lumbar spine. Electronically Signed   By: San Morelle M.D.   On: 03/01/2022 12:00   DG Chest Portable 1 View  Result Date: 03/01/2022 CLINICAL DATA:  Level 1 trauma.   Patient found down. EXAM: PORTABLE CHEST 1 VIEW COMPARISON:  None Available. FINDINGS: The heart size and mediastinal contours are within normal limits. Both lungs are clear. The visualized skeletal structures are unremarkable. IMPRESSION: No active disease. Electronically Signed   By: San Morelle M.D.   On: 03/01/2022 11:59    ROS:  As stated above in the HPI otherwise negative.  Blood pressure 98/67, pulse 76, temperature 98.9 F (37.2 C), temperature source Axillary, resp. rate 20, height '5\' 11"'$  (1.803 m), weight 74.1 kg, SpO2 97 %.    PE: Gen: Sleeping, difficult to arouse. Neck: Neck collar Lungs: CTA Bilaterally CV: RRR without M/G/R ABD: Soft, NTND, +BS Ext: No C/C/E  Assessment/Plan: 1) Coffee-ground emesis. 2) Anemia. 3) MCV CVA. 4) Afib. 5) Dementia.   Further evaluation will be performed with an EGD.  A likely candidate for the coffee-ground emesis is an esophagitis.  Plan: 1) EGD tomorrow with Dr. Silverio Decamp. 2) Agree with PPI. 3) Monitor HGB and transfuse as necessary.  Loreena Valeri D 03/02/2022, 2:47 PM

## 2022-03-02 NOTE — Consult Note (Signed)
Neurology Consultation  Reason for Consult: Stroke on MRI Referring Physician: Johnnye Sima  CC: Fall  History is obtained from: Chart review, no family at the bedside   HPI: [name] [name] is a 81 y.o. male with a past medical history of prior CVA, afib not on anticoagulation, HLD, GERD, and alzheimers and vascular dementia presenting as a level 1 trauma (fall on thinners) on 03/01/2022. Per chart review, family saw footage of him falling at home on cameras they use to check on him. When they got to him he was unresponsive and EMS was called. He did initially report left shoulder pain. He was in atrial fibrillation on arrival to ED. Nursing notes that he did have an episode of cough ground emesis on 12/3. Hgb 11.4  Family has noted a gradual decline for months including increasing confusion. He does not take any prescription medications and has been resistant to moving into an assisted living facility. Palliative care has been brought on board to discuss [place] and medical interventions. It has been determined that he will be DNR, but medically optimized with appropriate interventions. PT/OT/ST evaluation pending.    LKW: 12/2 tpa given?: no, outside of window Premorbid modified Rankin scale (mRS):  2-Slight disability-UNABLE to perform all activities but does not need assistance   ROS: Full ROS was performed and is negative except as noted in the HPI.   Past Medical History:  Diagnosis Date   Arthritis    Cataracts, bilateral    Chickenpox    Dyslipidemia    GERD (gastroesophageal reflux disease)    Rheumatoid arthritis ([place])     Family History  Problem Relation Age of Onset   Stroke Mother     Social History:   reports that he quit smoking about 33 years ago. He has never used smokeless tobacco. He reports that he does not drink alcohol and does not use drugs.  Medications  Current Facility-Administered Medications:    0.9 %  sodium chloride infusion, , Intravenous, Continuous,  Johny Blamer, DO, Last Rate: 125 mL/hr at 03/02/22 1409, New Bag at 03/02/22 1409   0.9 %  sodium chloride infusion, , Intravenous, Continuous, Carol Ada, MD   acetaminophen (TYLENOL) suppository 650 mg, 650 mg, Rectal, Q6H PRN, Rosezella Rumpf, NP, 650 mg at 03/02/22 0852   pantoprazole (PROTONIX) injection 40 mg, 40 mg, Intravenous, Q12H, Angelique Blonder, DO, 40 mg at 03/02/22 1652   Exam: Current vital signs: BP 112/70   Pulse 78   Temp 99.1 F (37.3 C) (Axillary)   Resp 16   Ht '5\' 11"'$  (1.803 m)   Wt 74.1 kg   SpO2 97%   BMI 22.78 kg/m  Vital signs in last 24 hours: Temp:  [98.9 F (37.2 C)-100.4 F (38 C)] 99.1 F (37.3 C) (12/03 1731) Pulse Rate:  [75-93] 78 (12/03 1642) Resp:  [16-21] 16 (12/03 1642) BP: (98-134)/(60-76) 112/70 (12/03 1642) SpO2:  [94 %-97 %] 97 % (12/03 1642)  GENERAL: Drowsy, in NAD HEENT: - Left temporal healing wound, dry mm, no LN++, no Thyromegally, left eye with crusted secretions but  LUNGS - Clear to auscultation bilaterally with no wheezes CV - S1S2 RRR, no m/r/g, equal pulses bilaterally. ABDOMEN - Soft, nontender, nondistended with normoactive BS Ext: warm, well perfused, intact peripheral pulses, no edema Skin: bruising of the LUE especially   NEURO:  Mental Status: Drowsy, difficult to arose, does not open eyes voluntarily and resists eye opening. Does not follow commands Language: speech is nonsensical and  stuttering, can make out the word "yes" but it is not in context with questions Cranial Nerves: PERRL 36m/brisk. Eyes are midline no blink to threat on the left, blinks to threat on the right, difficult to assess facial symmetry and occulocephalic reflex with C-collar.  Responds to voice, tongue/uvula/soft palate midline, head is midline. No evidence of tongue atrophy or fasciculations Motor:  RUE- moves purposefully and antigravity LUE- flaccid  RLE- moves antigravity   LUE- rigid, can not straighten leg, stays bent on the  bed.  Tone: is increased in bilateral lower extremities and left upper extremity and bulk is normal Sensation- localizes to pain in right upper and lower extremity and left lower extremity Localizes to pain in left upper extremity with opposite arm Coordination: No gross ataxia noted with spontaneous movement of right upper extremity, unable to follow commands on FNF or HKS Gait- deferred  On later attending exam, improved speech, states his DOB when asked how old he is, that it is "April" when asked the month, follows some simple commands (squeeze and let go of hand, close and open eyes), reports "I had a good day" but much of his speech remains difficult to understand. Right gaze preference, left hemianopia, left facial droop, 1/5 in the LUE and LLE (makes a slight attempt to move). Reduced sensation on the left  NIHSS components Score: Comment  1a Level of Conscious 0'[]'$  1'[x]'$  2'[]'$  3'[]'$         1b LOC Questions 0'[]'$  1'[]'$  2'[x]'$           1c LOC Commands 0'[]'$  1'[]'$  2'[x]'$           2 Best Gaze 0'[x]'$  1'[]'$  2'[]'$           3 Visual 0'[]'$  1'[x]'$  2'[]'$  3'[]'$         4 Facial Palsy 0'[x]'$  1'[]'$  2'[]'$  3'[]'$         5a Motor Arm - left 0'[]'$  1'[]'$  2'[]'$  3'[]'$  4'[x]'$  UN'[]'$     5b Motor Arm - Right 0'[x]'$  1'[]'$  2'[]'$  3'[]'$  4'[]'$  UN'[]'$     6a Motor Leg - Left 0'[]'$  1'[]'$  2'[]'$  3'[x]'$  4'[]'$  UN'[]'$     6b Motor Leg - Right 0'[x]'$  1'[]'$  2'[]'$  3'[]'$  4'[]'$  UN'[]'$     7 Limb Ataxia 0'[x]'$  1'[]'$  2'[]'$  3'[]'$  UN'[]'$       8 Sensory 0'[]'$  1'[x]'$  2'[]'$  UN'[]'$         9 Best Language 0'[]'$  1'[]'$  2'[x]'$  3'[]'$         10 Dysarthria 0'[]'$  1'[]'$  2'[x]'$  UN'[]'$         11 Extinct. and Inattention 0'[x]'$  1'[]'$  2'[]'$           TOTAL: 18        Labs I have reviewed labs in epic and the results pertinent to this consultation are:   CBC    Component Value Date/Time   WBC 18.4 (H) 03/02/2022 1051   RBC 3.57 (L) 03/02/2022 1051   HGB 11.4 (L) 03/02/2022 1051   HGB 16.4 10/19/2013 2051   HCT 34.6 (L) 03/02/2022 1051   HCT 49.3 10/19/2013 2051   PLT 229 03/02/2022 1051   PLT 258 10/19/2013 2051   MCV 96.9 03/02/2022 1051   MCV 95  10/19/2013 2051   MCH 31.9 03/02/2022 1051   MCHC 32.9 03/02/2022 1051   RDW 13.9 03/02/2022 1051   RDW 13.3 10/19/2013 2051   LYMPHSABS 1.4 03/02/2022 0119   LYMPHSABS 1.4 10/19/2013 2051   MONOABS 1.3 (H) 03/02/2022 0119   MONOABS 1.2 (H) 10/19/2013 2051   EOSABS 0.0 03/02/2022  0119   EOSABS 0.1 10/19/2013 2051   BASOSABS 0.0 03/02/2022 0119   BASOSABS 0.1 10/19/2013 2051    CMP     Component Value Date/Time   NA 138 03/02/2022 0119   NA 140 10/19/2013 2051   K 4.4 03/02/2022 0119   K 4.0 10/19/2013 2051   CL 109 03/02/2022 0119   CL 106 10/19/2013 2051   CO2 20 (L) 03/02/2022 0119   CO2 25 10/19/2013 2051   GLUCOSE 118 (H) 03/02/2022 0119   GLUCOSE 108 (H) 10/19/2013 2051   BUN 28 (H) 03/02/2022 0119   BUN 26 (H) 10/19/2013 2051   CREATININE 1.50 (H) 03/02/2022 0119   CREATININE 1.44 (H) 10/19/2013 2051   CALCIUM 9.3 03/02/2022 0119   CALCIUM 9.6 10/19/2013 2051   PROT 6.7 03/01/2022 1135   PROT 8.1 10/19/2013 2051   ALBUMIN 4.1 03/01/2022 1135   ALBUMIN 4.2 10/19/2013 2051   AST 74 (H) 03/01/2022 1135   AST 33 10/19/2013 2051   ALT 21 03/01/2022 1135   ALT 31 10/19/2013 2051   ALKPHOS 74 03/01/2022 1135   ALKPHOS 98 10/19/2013 2051   BILITOT 0.9 03/01/2022 1135   BILITOT 0.9 10/19/2013 2051   GFRNONAA 46 (L) 03/02/2022 0119   GFRNONAA 47 (L) 10/19/2013 2051   GFRAA >60 12/07/2015 0544   GFRAA 55 (L) 10/19/2013 2051    Lipid Panel     Component Value Date/Time   CHOL 169 03/01/2022 1839   TRIG 67 03/01/2022 1839   HDL 37 (L) 03/01/2022 1839   CHOLHDL 4.6 03/01/2022 1839   VLDL 13 03/01/2022 1839   LDLCALC 119 (H) 03/01/2022 1839   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 There is subcutaneous hematoma in the frontal and left parietal scalp. No fracture is seen in calvarium.  MRI examination of the brain 1.  Acute/subacute right MCA territory infarct involving the right caudate head internal capsule and lentiform nucleus as well as more diffusely over the right temporal and parietal lobe as well as the posterior right frontal lobe. 2. Areas of susceptibility within the posterior right lentiform nucleus and caudate head consistent with petechial hemorrhage. 3. Generalized atrophy and white matter disease is otherwise stable. This likely reflects the sequela of chronic microvascular ischemia. 4. Remote lacunar infarcts of the left cerebellum. 5. Diffuse sinus disease.  MR C-spine wo 1. Prevertebral fluid from the skull base through C6-7 without adjacent bone edema. This is nonspecific, but can be seen in the setting of acute trauma suggesting anterior ligamentous injury. No associated fracture. 2. Minimal edema along the posterior spinous ligament at the C4-5 level more inferiorly on the left into the paraspinous soft tissues. 3. Moderate left and mild right foraminal narrowing at C3-4. 4. Moderate foraminal narrowing bilaterally at C4-5 is worse on the left. 5. Moderate foraminal narrowing bilaterally at C6-7 is right greater than left. 6. Mild facet hypertrophy and a broad-based disc protrusion at C7-T1 without significant stenosis.  Assessment:  81 year old male with a past medical history of prior CVA, afib not on anticoagulation, HLD, GERD, and alzheimers and vascular dementia presenting as a level 1 trauma (fall on thinners) on 03/01/2022. MRI confirms a right MCA infarct. At the time of NP exam he is resting in bed with eyes closed and largely globally aphasic. He does not follow commands or open his eyes  for NP, but is more verbal on later attending eval and able to follow some commands. Medically ill with fever of unclear eitology and hematemesis.    Impression: Right MCA territory infarct in the setting of atrial fibrillation not on anticoagulation   Recommendations: - HgbA1c, fasting lipid  panel, goal A1c < 7%, LDL < 70; adjust meds as needed - Frequent neuro checks - Echocardiogram not indicated from neurological perspective given known Afib (cancelled, please re-order if needed for primary cardiac concerns) - MRA and carotid duplex - Hold antiplatelet in the setting of GI bleed and hemoglobin drop, pending resumption of AC would use ASA 325 mg daily PO or 300 mg daily PR when cleared medically - From stroke perspective, repeat head CT in 7 days, if no acute process may start anticoagulation at that time if medically cleared (and would stop ASA 325 mg)  - Risk factor modification - Telemetry monitoring - Given no source of fever yet identified, consider blood cultures - PT consult, OT consult, Speech consult - EEG to screen for epileptogenicity given fluctuating exam - Trend CK to peak, IVF per primary team - Stroke team to follow  Patient seen and examined by NP/APP with MD. MD to update note as needed.   Janine Ores, DNP, FNP-BC [facility] Pager: [phone]   Lesleigh Noe MD-PhD [facility] [phone] Available 7 AM to 7 PM, outside these hours please contact Neurologist on call listed on AMION  Attending Neurologist's note:  I personally saw this patient, gathering history, performing a full neurologic examination, reviewing relevant labs, personally reviewing relevant imaging including Head CT, MRI brain, MRI c-spine and formulated the assessment and plan, adding the note above for completeness and clarity to accurately reflect my thoughts

## 2022-03-02 NOTE — Consult Note (Incomplete)
Neurology Consultation  Reason for Consult: Stroke on MRI Referring Physician: Johnnye Sima  CC: Fall  History is obtained from: Chart review, no family at the bedside   HPI: [name] [name] is a 81 y.o. male with a past medical history of prior CVA, afib not on anticoagulation, HLD, GERD, and alzheimers and vascular dementia presenting as a level 1 trauma (fall on thinners) on 03/01/2022. Per chart review, family saw footage of him falling at home on cameras they use to check on him. When they got to him he was unresponsive and EMS was called. He did initially report left shoulder pain. He was in atrial fibrillation on arrival to ED. Nursing notes that he did have an episode of cough ground emesis on 12/3. Hgb 11.4  Family has noted a gradual decline for months including increasing confusion. He does not take any prescription medications and has been resistant to moving into an assisted living facility. Palliative care has been brought on board to discuss [place] and medical interventions. It has been determined that he will be DNR, but medically optimized with appropriate interventions. PT/OT/ST evaluation pending.    LKW: 12/2 tpa given?: no, outside of window Premorbid modified Rankin scale (mRS):  2-Slight disability-UNABLE to perform all activities but does not need assistance   ROS: Full ROS was performed and is negative except as noted in the HPI.   Past Medical History:  Diagnosis Date   Arthritis    Cataracts, bilateral    Chickenpox    Dyslipidemia    GERD (gastroesophageal reflux disease)    Rheumatoid arthritis ([place])     Family History  Problem Relation Age of Onset   Stroke Mother     Social History:   reports that he quit smoking about 33 years ago. He has never used smokeless tobacco. He reports that he does not drink alcohol and does not use drugs.  Medications  Current Facility-Administered Medications:    0.9 %  sodium chloride infusion, , Intravenous, Continuous,  Johny Blamer, DO, Last Rate: 125 mL/hr at 03/02/22 0533, New Bag at 03/02/22 0533   acetaminophen (TYLENOL) suppository 650 mg, 650 mg, Rectal, Q6H PRN, Rosezella Rumpf, NP, 650 mg at 03/02/22 0852   diltiazem (CARDIZEM) 125 mg in dextrose 5% 125 mL (1 mg/mL) infusion, 5-15 mg/hr, Intravenous, Continuous, Davonna Belling, MD, Last Rate: 15 mL/hr at 03/02/22 0531, 15 mg/hr at 03/02/22 0531   Exam: Current vital signs: BP 99/71 (BP Location: Left Arm)   Pulse 75   Temp 98.9 F (37.2 C) (Axillary)   Resp 20   Ht '5\' 11"'$  (1.803 m)   Wt 74.1 kg   SpO2 95%   BMI 22.78 kg/m  Vital signs in last 24 hours: Temp:  [98.1 F (36.7 C)-100.4 F (38 C)] 98.9 F (37.2 C) (12/03 1100) Pulse Rate:  [75-136] 75 (12/03 1100) Resp:  [14-24] 20 (12/03 1100) BP: (99-137)/(60-90) 99/71 (12/03 1100) SpO2:  [94 %-100 %] 95 % (12/03 1100) Weight:  [74.1 kg] 74.1 kg (12/02 1851)  GENERAL: Drowsy, in NAD HEENT: - Normocephalic and atraumatic, dry mm, no LN++, no Thyromegally LUNGS - Clear to auscultation bilaterally with no wheezes CV - S1S2 RRR, no m/r/g, equal pulses bilaterally. ABDOMEN - Soft, nontender, nondistended with normoactive BS Ext: warm, well perfused, intact peripheral pulses, no edema  NEURO:  Mental Status: Drowsy, difficult to arose, does not open eyes voluntarily and resists eye opening. Does not follow commands Language: speech is nonsensical and stuttering, can make  out the word "yes" but it is not in context with questions Cranial Nerves: PERRL 79m/brisk. Eyes are midline no blink to threat on the left, blinks to threat on the right, difficult to assess facial symmetry and occulocephalic reflex with C-collar.  Responds to voice, tongue/uvula/soft palate midline, head is midline. No evidence of tongue atrophy or fasciculations Motor:  RUE- moves purposefully and antigravity LUE- flaccid  RLE- moves antigravity   LUE- rigid, can not straighten leg, stays bent on the bed.   Tone: is increased in bilateral lower extremities and left upper extremity and bulk is normal Sensation- localizes to pain in right upper and lower extremity and left lower extremity Localizes to pain in left upper extremity with opposite arm Coordination: No gross ataxia noted with spontaneous movement of right upper extremity, unable to follow commands on FNF or HKS Gait- deferred  NIHSS components Score: Comment  1a Level of Conscious 0'[]'$  1'[x]'$  2'[]'$  3'[]'$         1b LOC Questions 0'[]'$  1'[]'$  2'[x]'$           1c LOC Commands 0'[]'$  1'[]'$  2'[x]'$           2 Best Gaze 0'[x]'$  1'[]'$  2'[]'$           3 Visual 0'[]'$  1'[x]'$  2'[]'$  3'[]'$         4 Facial Palsy 0'[x]'$  1'[]'$  2'[]'$  3'[]'$         5a Motor Arm - left 0'[]'$  1'[]'$  2'[]'$  3'[]'$  4'[x]'$  UN'[]'$     5b Motor Arm - Right 0'[x]'$  1'[]'$  2'[]'$  3'[]'$  4'[]'$  UN'[]'$     6a Motor Leg - Left 0'[]'$  1'[]'$  2'[]'$  3'[x]'$  4'[]'$  UN'[]'$     6b Motor Leg - Right 0'[x]'$  1'[]'$  2'[]'$  3'[]'$  4'[]'$  UN'[]'$     7 Limb Ataxia 0'[x]'$  1'[]'$  2'[]'$  3'[]'$  UN'[]'$       8 Sensory 0'[]'$  1'[x]'$  2'[]'$  UN'[]'$         9 Best Language 0'[]'$  1'[]'$  2'[x]'$  3'[]'$         10 Dysarthria 0'[]'$  1'[]'$  2'[x]'$  UN'[]'$         11 Extinct. and Inattention 0'[x]'$  1'[]'$  2'[]'$           TOTAL: 18        Labs I have reviewed labs in epic and the results pertinent to this consultation are:   CBC    Component Value Date/Time   WBC 18.4 (H) 03/02/2022 1051   RBC 3.57 (L) 03/02/2022 1051   HGB 11.4 (L) 03/02/2022 1051   HGB 16.4 10/19/2013 2051   HCT 34.6 (L) 03/02/2022 1051   HCT 49.3 10/19/2013 2051   PLT 229 03/02/2022 1051   PLT 258 10/19/2013 2051   MCV 96.9 03/02/2022 1051   MCV 95 10/19/2013 2051   MCH 31.9 03/02/2022 1051   MCHC 32.9 03/02/2022 1051   RDW 13.9 03/02/2022 1051   RDW 13.3 10/19/2013 2051   LYMPHSABS 1.4 03/02/2022 0119   LYMPHSABS 1.4 10/19/2013 2051   MONOABS 1.3 (H) 03/02/2022 0119   MONOABS 1.2 (H) 10/19/2013 2051   EOSABS 0.0 03/02/2022 0119   EOSABS 0.1 10/19/2013 2051   BASOSABS 0.0 03/02/2022 0119   BASOSABS 0.1 10/19/2013 2051    CMP     Component Value Date/Time   NA 138  03/02/2022 0119   NA 140 10/19/2013 2051   K 4.4 03/02/2022 0119   K 4.0 10/19/2013 2051   CL 109 03/02/2022 0119   CL 106 10/19/2013 2051   CO2 20 (L) 03/02/2022 0119   CO2 25 10/19/2013 2051  GLUCOSE 118 (H) 03/02/2022 0119   GLUCOSE 108 (H) 10/19/2013 2051   BUN 28 (H) 03/02/2022 0119   BUN 26 (H) 10/19/2013 2051   CREATININE 1.50 (H) 03/02/2022 0119   CREATININE 1.44 (H) 10/19/2013 2051   CALCIUM 9.3 03/02/2022 0119   CALCIUM 9.6 10/19/2013 2051   PROT 6.7 03/01/2022 1135   PROT 8.1 10/19/2013 2051   ALBUMIN 4.1 03/01/2022 1135   ALBUMIN 4.2 10/19/2013 2051   AST 74 (H) 03/01/2022 1135   AST 33 10/19/2013 2051   ALT 21 03/01/2022 1135   ALT 31 10/19/2013 2051   ALKPHOS 74 03/01/2022 1135   ALKPHOS 98 10/19/2013 2051   BILITOT 0.9 03/01/2022 1135   BILITOT 0.9 10/19/2013 2051   GFRNONAA 46 (L) 03/02/2022 0119   GFRNONAA 47 (L) 10/19/2013 2051   GFRAA >60 12/07/2015 0544   GFRAA 55 (L) 10/19/2013 2051    Lipid Panel     Component Value Date/Time   CHOL 169 03/01/2022 1839   TRIG 67 03/01/2022 1839   HDL 37 (L) 03/01/2022 1839   CHOLHDL 4.6 03/01/2022 1839   VLDL 13 03/01/2022 1839   LDLCALC 119 (H) 03/01/2022 1839     Imaging I have reviewed the images obtained:  CT-head No acute intracranial findings are seen in noncontrast CT brain. There is subcutaneous hematoma in the frontal and left parietal scalp. No fracture is seen in calvarium.  MRI examination of the brain 1. Acute/subacute right MCA territory infarct involving the right caudate head internal capsule and lentiform nucleus as well as more diffusely over the right temporal and parietal lobe as well as the posterior right frontal lobe. 2. Areas of susceptibility within the posterior right lentiform nucleus and caudate head consistent with petechial hemorrhage. 3. Generalized atrophy and white matter disease is otherwise stable. This likely reflects the sequela of chronic microvascular ischemia. 4.  Remote lacunar infarcts of the left cerebellum. 5. Diffuse sinus disease.  MR C-spine wo 1. Prevertebral fluid from the skull base through C6-7 without adjacent bone edema. This is nonspecific, but can be seen in the setting of acute trauma suggesting anterior ligamentous injury. No associated fracture. 2. Minimal edema along the posterior spinous ligament at the C4-5 level more inferiorly on the left into the paraspinous soft tissues. 3. Moderate left and mild right foraminal narrowing at C3-4. 4. Moderate foraminal narrowing bilaterally at C4-5 is worse on the left. 5. Moderate foraminal narrowing bilaterally at C6-7 is right greater than left. 6. Mild facet hypertrophy and a broad-based disc protrusion at C7-T1 without significant stenosis.  Assessment:  81 year old male with a past medical history of prior CVA, afib not on anticoagulation, HLD, GERD, and alzheimers and vascular dementia presenting as a level 1 trauma (fall on thinners) on 03/01/2022. MRI confirms a right MCA infarct. At the time of my exam he is resting in bed with eyes closed and largely globally aphasic. He does not follow commands or open his eyes for me.   Impression: Right MCA territory infarct in the setting of atrial fibrillation not on anticoagulation   Recommendations: - HgbA1c, fasting lipid panel - Frequent neuro checks - Echocardiogram not indicated from neurological perspective given known Afib (cancelled, please re-order if needed for primary cardiac concerns) - MRA and carotid duplex - Hold antiplatelet in the setting of GI bleed and hemoglobin drop, pending resumption of AC would use ASA 325 mg daily PO or 300 mg daily PR when cleared medically - From stroke perspective, repeat  head CT in 7 days, if no acute process may start anticoagulation at that time (and would stop ASA 325 mg)  - Risk factor modification - Telemetry monitoring - PT consult, OT consult, Speech consult] - EEG to screen for  epileptogenicity  - Stroke team to follow   Patient seen and examined by NP/APP with MD. MD to update note as needed.   Janine Ores, DNP, FNP-BC [facility] Pager: [phone]

## 2022-03-02 NOTE — Progress Notes (Signed)
HD#0 Subjective:   Summary: [name] [name] is a 81 y.o. male with PMH of prior CVA, mixed Alzheimer's and vascular dementia, Afib not on anticoagulation who presents after fall and admitted for Afib with RVR and acute right MCA stroke.  Overnight Events: fever 100.4 last night, episode of coffee ground emesis early this morning  Patient is more confused and mumbles more today. Nursing noted episode of coffee ground emesis early this morning. No further hematemesis noted. No fever this morning.   Objective:  Vital signs in last 24 hours: Vitals:   03/02/22 0041 03/02/22 0443 03/02/22 0812 03/02/22 1100  BP: 104/68 104/63 134/76 99/71  Pulse: 93 76 93 75  Resp: '20 19 20 20  '$ Temp: 99.3 F (37.4 C) 100.3 F (37.9 C) 99.3 F (37.4 C) 98.9 F (37.2 C)  TempSrc: Axillary Axillary Oral Axillary  SpO2: 95% 95% 94% 95%  Weight:      Height:       Supplemental O2: Room Air SpO2: 95 %   Physical Exam:  Constitutional: ill-appearing male, laying in bed, in mild distress HEENT: normocephalic, mild swelling around the left eye Neck: c-spine collar in place Cardiovascular: irregular rhythm, normal rate Pulmonary/Chest: normal work of breathing on room air Abdominal: soft, non-tender, non-distended Neurological: confused and only mumbling few words Skin: bruising over left UE and left lateral hip  Filed Weights   03/01/22 1222 03/01/22 1851  Weight: 75 kg 74.1 kg     Intake/Output Summary (Last 24 hours) at 03/02/2022 1332 Last data filed at 03/01/2022 2359 Gross per 24 hour  Intake 376.7 ml  Output 150 ml  Net 226.7 ml   Net IO Since Admission: 226.7 mL [03/02/22 1332]  Pertinent Labs:    Latest Ref Rng & Units 03/02/2022   10:51 AM 03/02/2022    1:19 AM 03/01/2022   11:41 AM  CBC  WBC 4.0 - 10.5 K/uL 18.4  19.3    Hemoglobin 13.0 - 17.0 g/dL 11.4  11.7  13.6   Hematocrit 39.0 - 52.0 % 34.6  33.3  40.0   Platelets 150 - 400 K/uL 229  253         Latest Ref Rng  & Units 03/02/2022    1:19 AM 03/01/2022   11:41 AM 03/01/2022   11:35 AM  CMP  Glucose 70 - 99 mg/dL 118  144  151   BUN 8 - 23 mg/dL '28  28  28   '$ Creatinine 0.61 - 1.24 mg/dL 1.50  1.40  1.62   Sodium 135 - 145 mmol/L 138  139  138   Potassium 3.5 - 5.1 mmol/L 4.4  4.3  4.2   Chloride 98 - 111 mmol/L 109  106  106   CO2 22 - 32 mmol/L 20   19   Calcium 8.9 - 10.3 mg/dL 9.3   9.7   Total Protein 6.5 - 8.1 g/dL   6.7   Total Bilirubin 0.3 - 1.2 mg/dL   0.9   Alkaline Phos 38 - 126 U/L   74   AST 15 - 41 U/L   74   ALT 0 - 44 U/L   21     Imaging: MR Cervical Spine Wo Contrast  Result Date: 03/01/2022 CLINICAL DATA:  Neck trauma. Ligament injury suspected. Patient was found down. EXAM: MRI CERVICAL SPINE WITHOUT CONTRAST TECHNIQUE: Multiplanar, multisequence MR imaging of the cervical spine was performed. No intravenous contrast was administered. COMPARISON:  CT of the cervical  spine without contrast 03/01/2022 FINDINGS: Alignment: Slight anterolisthesis at C7-T1 and reversal of the upper cervical lordosis is stable. Vertebrae: Marrow signal and vertebral body heights in the cervical spine are normal. Diffuse decreased marrow signal is present in the skull. No discrete lesions are present. Cord: Normal signal and morphology. Posterior Fossa, vertebral arteries, paraspinal tissues: Craniocervical junction is within normal limits. Prevertebral fluid is evident from the skull base through C6-7. No adjacent bone edema is present. Minimal edema is present along the posterior spinous ligament at the C4-5 level more inferiorly on the left into the paraspinous soft tissues. Disc levels: C2-3: Negative. C3-4: Asymmetric left-sided uncovertebral and facet hypertrophy present. Moderate left and mild right foraminal narrowing is present. C4-5: A broad-based disc osteophyte complex effaces the ventral CSF. Moderate foraminal narrowing is worse on the left. C5-6: Shallow central disc protrusion partially  effaces the ventral CSF. The foramina are patent. C6-7: A broad-based disc osteophyte complex present. Uncovertebral spurring results in moderate foraminal narrowing, right greater than left. C7-T1: Mild facet hypertrophy is present bilaterally. Uncovering of a broad-based disc protrusion is present. No significant stenosis is present. IMPRESSION: 1. Prevertebral fluid from the skull base through C6-7 without adjacent bone edema. This is nonspecific, but can be seen in the setting of acute trauma suggesting anterior ligamentous injury. No associated fracture. 2. Minimal edema along the posterior spinous ligament at the C4-5 level more inferiorly on the left into the paraspinous soft tissues. 3. Moderate left and mild right foraminal narrowing at C3-4. 4. Moderate foraminal narrowing bilaterally at C4-5 is worse on the left. 5. Moderate foraminal narrowing bilaterally at C6-7 is right greater than left. 6. Mild facet hypertrophy and a broad-based disc protrusion at C7-T1 without significant stenosis. Electronically Signed   By: San Morelle M.D.   On: 03/01/2022 17:54   MR BRAIN WO CONTRAST  Result Date: 03/01/2022 CLINICAL DATA:  Head trauma.  Patient found down. EXAM: MRI HEAD WITHOUT CONTRAST TECHNIQUE: Multiplanar, multiecho pulse sequences of the brain and surrounding structures were obtained without intravenous contrast. COMPARISON:  CT head without contrast 03/01/2022 FINDINGS: Brain: The diffusion-weighted images demonstrate no acute right MCA territory infarct. The infarct involves the right caudate head internal capsule and lentiform nucleus. The insular cortex is spared. Cortical and subcortical involvement of the right parietal and temporal lobe is present. Posterior right frontal lobe is involved. Areas of susceptibility are present within the posterior right lentiform nucleus and caudate head consistent with petechial hemorrhage. Generalized atrophy and white matter disease is otherwise  stable. No significant extraaxial fluid collection is present. Remote lacunar infarcts are present within the left cerebellum. Brainstem and cerebellum are otherwise within normal limits. Vascular: Flow is present in the major intracranial arteries. Skull and upper cervical spine: The craniocervical junction is normal. Upper cervical spine is within normal limits. Degenerative changes are present in the upper cervical spine. Craniocervical junction is normal. Marrow signal in the skull is diffusely depressed. No discrete lesions are evident. Sinuses/Orbits: Circumferential mucosal thickening is present in the maxillary sinuses bilaterally. No fluid levels are present. Scattered mucosal thickening is present throughout the ethmoid air cells and left frontal sinus. Mastoid air cells are clear. Bilateral lens replacements are noted. Globes and orbits are otherwise unremarkable. IMPRESSION: 1. Acute/subacute right MCA territory infarct involving the right caudate head internal capsule and lentiform nucleus as well as more diffusely over the right temporal and parietal lobe as well as the posterior right frontal lobe. 2. Areas of susceptibility within the  posterior right lentiform nucleus and caudate head consistent with petechial hemorrhage. 3. Generalized atrophy and white matter disease is otherwise stable. This likely reflects the sequela of chronic microvascular ischemia. 4. Remote lacunar infarcts of the left cerebellum. 5. Diffuse sinus disease. These results were called by telephone at the time of interpretation on 03/01/2022 at 5:48 pm to provider [name] [name], who verbally acknowledged these results. Electronically Signed   By: San Morelle M.D.   On: 03/01/2022 17:48    Assessment/Plan:   Principal Problem:   Atrial fibrillation with RVR ([place]) Active Problems:   Cerebrovascular accident (CVA) ([place])   Dark emesis   Fall   Patient Summary: [name] [name] is a 81 y.o. with a pertinent PMH  of prior CVA, mixed Alzheimer's and vascular dementia, Afib not on anticoagulation who presented after fall and admitted for Afib with RVR and acute right MCA stroke.  Fall Acute right MCA CVA Hx of prior CVA Patient appears more confused today. Mumbling few words. Episode of hematemesis this morning but no further episodes. Had meeting with family and palliative care today. Discussed GOC and son who is HPOA believes patient would be willing to continue medical management given significant event. Failed initial swallow study, currently NPO. LDL 119.  -appreciate neurology assistance with his care -hold ASA -pending A1c -PT/OT/SLP consults -neuro checks  Hematemesis Patient had episode of coffee ground emesis this morning. No other episodes before or after. Son denies past history of ulcers or hematemesis. Exam of throat showed clear and dry. Hgb relatively unchanged 11.4 from 11.7. Will consult GI and continue IV fluids and Protonix.   -IV Protonix 40 mg q12h -GI consult  Atrial fibrillation, rate controlled Patient with history of Afib not on anticoagulation due to patient refusal. Initially presented with RVR but now rate controlled with diltiazem.  -diltiazem infusion  AKI Rhabdomyolysis  CK 3,449 on admission. Patient was on floor for several hours. Admission creatinine 1.62 and today 1.50. Will continue IV fluids.  -NS 125 ml/hr.  Abdominal aortic aneurysm CT A/P showed 5 cm infrarenal AAA which measured 4.1 cm on 01/10/2015. Will need follow-up every 6 months with vascular consultation in outpatient setting.   Hx of mixed Alzheimer and vascular dementia Son reports baseline confusion but redirectable. Will carry conversations but at times mumbles. Patient lives alone but family has installed cameras. Son is working on possible nursing facilities but patient has been resistant.   Goals of Care Son is HPOA. Patient had expressed previously about being DNR and will honor his  wishes. Had meeting with family and palliative care about GOC. Son still believes patient would want medical management for his CVA given this is unexpected and significant event. Will continue discussion about goals of care. Appreciate palliative care team's assistance.     Diet: NPO IVF: NS,125cc/hr VTE: None Code: DNR PT/OT recs: Pending TOC recs: pending Family Update: updated son and daughter-in-law at bedside today   Dispo: pending medical stability and [place] discussion   Angelique Blonder, DO Internal Medicine Resident PGY-1 Pager: [phone] Please contact the on call pager after 5 pm and on weekends at [phone].

## 2022-03-02 NOTE — Plan of Care (Signed)

## 2022-03-03 ENCOUNTER — Encounter (HOSPITAL_COMMUNITY): Payer: Self-pay | Admitting: Infectious Diseases

## 2022-03-03 ENCOUNTER — Encounter (HOSPITAL_COMMUNITY)
Admission: EM | Disposition: A | Discharge status: Skilled Nursing Facility | Payer: Self-pay | Source: Home / Self Care | Attending: Internal Medicine

## 2022-03-03 ENCOUNTER — Inpatient Hospital Stay (HOSPITAL_COMMUNITY): Payer: Medicare Other | Admitting: Anesthesiology

## 2022-03-03 ENCOUNTER — Inpatient Hospital Stay (HOSPITAL_COMMUNITY): Payer: Medicare Other

## 2022-03-03 DIAGNOSIS — K2211 Ulcer of esophagus with bleeding: Secondary | ICD-10-CM

## 2022-03-03 DIAGNOSIS — K449 Diaphragmatic hernia without obstruction or gangrene: Secondary | ICD-10-CM | POA: Diagnosis not present

## 2022-03-03 DIAGNOSIS — I10 Essential (primary) hypertension: Secondary | ICD-10-CM

## 2022-03-03 DIAGNOSIS — I639 Cerebral infarction, unspecified: Secondary | ICD-10-CM

## 2022-03-03 DIAGNOSIS — K2210 Ulcer of esophagus without bleeding: Secondary | ICD-10-CM | POA: Diagnosis present

## 2022-03-03 DIAGNOSIS — Z87891 Personal history of nicotine dependence: Secondary | ICD-10-CM

## 2022-03-03 DIAGNOSIS — K2100 Gastro-esophageal reflux disease with esophagitis, without bleeding: Secondary | ICD-10-CM

## 2022-03-03 DIAGNOSIS — K2101 Gastro-esophageal reflux disease with esophagitis, with bleeding: Secondary | ICD-10-CM

## 2022-03-03 DIAGNOSIS — I4891 Unspecified atrial fibrillation: Secondary | ICD-10-CM | POA: Diagnosis not present

## 2022-03-03 HISTORY — PX: BIOPSY: SHX5522

## 2022-03-03 HISTORY — PX: ESOPHAGOGASTRODUODENOSCOPY (EGD) WITH PROPOFOL: SHX5813

## 2022-03-03 LAB — CBC WITH DIFFERENTIAL/PLATELET
Abs Immature Granulocytes: 0.07 10*3/uL (ref 0.00–0.07)
Basophils Absolute: 0 10*3/uL (ref 0.0–0.1)
Basophils Relative: 0 %
Eosinophils Absolute: 0.1 10*3/uL (ref 0.0–0.5)
Eosinophils Relative: 1 %
HCT: 32.2 % — ABNORMAL LOW (ref 39.0–52.0)
Hemoglobin: 10.6 g/dL — ABNORMAL LOW (ref 13.0–17.0)
Immature Granulocytes: 1 %
Lymphocytes Relative: 12 %
Lymphs Abs: 1.4 10*3/uL (ref 0.7–4.0)
MCH: 32 pg (ref 26.0–34.0)
MCHC: 32.9 g/dL (ref 30.0–36.0)
MCV: 97.3 fL (ref 80.0–100.0)
Monocytes Absolute: 0.9 10*3/uL (ref 0.1–1.0)
Monocytes Relative: 8 %
Neutro Abs: 9.5 10*3/uL — ABNORMAL HIGH (ref 1.7–7.7)
Neutrophils Relative %: 78 %
Platelets: 210 10*3/uL (ref 150–400)
RBC: 3.31 MIL/uL — ABNORMAL LOW (ref 4.22–5.81)
RDW: 13.6 % (ref 11.5–15.5)
WBC: 12 10*3/uL — ABNORMAL HIGH (ref 4.0–10.5)
nRBC: 0 % (ref 0.0–0.2)

## 2022-03-03 LAB — BASIC METABOLIC PANEL
Anion gap: 5 (ref 5–15)
BUN: 27 mg/dL — ABNORMAL HIGH (ref 8–23)
CO2: 21 mmol/L — ABNORMAL LOW (ref 22–32)
Calcium: 8.5 mg/dL — ABNORMAL LOW (ref 8.9–10.3)
Chloride: 113 mmol/L — ABNORMAL HIGH (ref 98–111)
Creatinine, Ser: 1.33 mg/dL — ABNORMAL HIGH (ref 0.61–1.24)
GFR, Estimated: 53 mL/min — ABNORMAL LOW (ref >60)
Glucose, Bld: 104 mg/dL — ABNORMAL HIGH (ref 70–99)
Potassium: 3.9 mmol/L (ref 3.5–5.1)
Sodium: 139 mmol/L (ref 135–145)

## 2022-03-03 LAB — HEMOGLOBIN A1C
Hgb A1c MFr Bld: 5.3 % (ref 4.8–5.6)
Mean Plasma Glucose: 105 mg/dL

## 2022-03-03 LAB — CK: Total CK: 3218 U/L — ABNORMAL HIGH (ref 49–397)

## 2022-03-03 SURGERY — ESOPHAGOGASTRODUODENOSCOPY (EGD) WITH PROPOFOL
Anesthesia: Monitor Anesthesia Care

## 2022-03-03 MED ORDER — POLYMYXIN B-TRIMETHOPRIM 10000-0.1 UNIT/ML-% OP SOLN
1.0000 [drp] | Freq: Four times a day (QID) | OPHTHALMIC | Status: DC
  Administered 2022-03-03 – 2022-03-06 (×13): 1 [drp] via OPHTHALMIC
  Filled 2022-03-03: qty 10

## 2022-03-03 MED ORDER — DILTIAZEM HCL-DEXTROSE 125-5 MG/125ML-% IV SOLN (PREMIX)
5.0000 mg/h | INTRAVENOUS | Status: DC
  Administered 2022-03-03: 5 mg/h via INTRAVENOUS
  Administered 2022-03-04: 10 mg/h via INTRAVENOUS
  Filled 2022-03-03 (×2): qty 125

## 2022-03-03 MED ORDER — PROPOFOL 500 MG/50ML IV EMUL
INTRAVENOUS | Status: DC | PRN
  Administered 2022-03-03: 100 ug/kg/min via INTRAVENOUS

## 2022-03-03 MED ORDER — ASPIRIN 325 MG PO TABS
325.0000 mg | ORAL_TABLET | Freq: Every day | ORAL | Status: DC
  Administered 2022-03-03 – 2022-03-04 (×2): 325 mg via ORAL
  Filled 2022-03-03 (×2): qty 1

## 2022-03-03 MED ORDER — ASPIRIN 300 MG RE SUPP
300.0000 mg | Freq: Every day | RECTAL | Status: DC
  Filled 2022-03-03 (×2): qty 1

## 2022-03-03 MED ORDER — ASPIRIN 81 MG PO TBEC: 81.0000 mg | DELAYED_RELEASE_TABLET | Freq: Every day | ORAL | Status: DC

## 2022-03-03 SURGICAL SUPPLY — 15 items

## 2022-03-03 NOTE — Progress Notes (Signed)
Carotid artery duplex has been completed. Preliminary results can be found in CV Proc through chart review.   03/03/22 1:24 PM [name] [name] RVT

## 2022-03-03 NOTE — Op Note (Signed)
[facility] Patient Name: [name] [name] Procedure Date : 03/03/2022 MRN: [id number] Attending MD: Mauri Pole , MD, [id number] Date of Birth: [birth year] CSN: [id number] Age: 81 Admit Type: Inpatient Procedure:                Upper GI endoscopy Indications:              Recent gastrointestinal bleeding Providers:                Mauri Pole, MD, Dulcy Fanny, Brien Mates, Technician Referring MD:              Medicines:                Monitored Anesthesia Care Complications:            No immediate complications. Estimated Blood Loss:     Estimated blood loss was minimal. Procedure:                Pre-Anesthesia Assessment:                           - Prior to the procedure, a History and Physical                            was performed, and patient medications and                            allergies were reviewed. The patient's tolerance of                            previous anesthesia was also reviewed. The risks                            and benefits of the procedure and the sedation                            options and risks were discussed with the patient.                            All questions were answered, and informed consent                            was obtained. Prior Anticoagulants: The patient                            last took Eliquis (apixaban) 2 days prior to the                            procedure. ASA Grade Assessment: IV - A patient                            with severe systemic disease that is a constant  threat to life. After reviewing the risks and                            benefits, the patient was deemed in satisfactory                            condition to undergo the procedure.                           After obtaining informed consent, the endoscope was                            passed under direct vision. Throughout the                             procedure, the patient's blood pressure, pulse, and                            oxygen saturations were monitored continuously. The                            [id number]-[id number] ([id number]) Olympus endoscope was introduced                            through the mouth, and advanced to the second part                            of duodenum. The upper GI endoscopy was                            accomplished without difficulty. The patient                            tolerated the procedure well. Scope In: Scope Out: Findings:      LA Grade C (one or more mucosal breaks continuous between tops of 2 or       more mucosal folds, less than 75% circumference) esophagitis with no       bleeding was found 30 to 40 cm from the incisors.      Few superficial esophageal ulcers with no bleeding and no stigmata of       recent bleeding were found 35 to 40 cm from the incisors. The largest       lesion was 5 mm in largest dimension. Biopsies were taken with a cold       forceps for histology.      A 4 cm hiatal hernia was present.      The exam of the stomach was otherwise normal.      The examined duodenum was normal. 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                           - Esophageal ulcers with no bleeding and no  stigmata of recent bleeding. Biopsied.                           - 4 cm hiatal hernia.                           - Normal examined duodenum. Recommendation:           - Advance to low residue diet as tolerated if                            mental status improves                           - Continue present medications.                           - Follow an antireflux regimen.                           - Use Protonix (pantoprazole) 40 mg IV twice daily.                           - OK to restart antiplatelet and anticoagulation as                            indicated, no contraindication from GI standpoint                            - Avoid NSAID's                           - GI will sign off, available if have any questions Procedure Code(s):        --- Professional ---                           [phone], Esophagogastroduodenoscopy, flexible,                            transoral; with biopsy, single or multiple Diagnosis Code(s):        --- Professional ---                           K21.00, Gastro-esophageal reflux disease with                            esophagitis, without bleeding                           K22.10, Ulcer of esophagus without bleeding                           K44.9, Diaphragmatic hernia without obstruction or                            gangrene  K92.2, Gastrointestinal hemorrhage, unspecified CPT copyright 2022 [employer]. All rights reserved. The codes documented in this report are preliminary and upon coder review may  be revised to meet current compliance requirements. Mauri Pole, MD 03/03/2022 10:21:00 AM This report has been signed electronically. Number of Addenda: 0

## 2022-03-03 NOTE — Progress Notes (Addendum)
STROKE TEAM PROGRESS NOTE   INTERVAL HISTORY On evaluation today patient is alert but oriented only to self. He is only intermittently following commands on the right side with notable left-sided neglect and right gaze preference. L eyelid is closed. Vital signs are stable. Vitals:   03/03/22 1015 03/03/22 1030 03/03/22 1040 03/03/22 1159  BP: 107/85 114/84 120/81 (!) 121/96  Pulse: (!) 109 (!) 108 100 (!) 118  Resp: (!) 21 (!) 23 (!) 22 20  Temp:   98.6 F (37 C) 97.8 F (36.6 C)  TempSrc:    Oral  SpO2: 95% 98% 98% 98%  Weight:      Height:       CBC:  Recent Labs  Lab 03/02/22 0119 03/02/22 1051 03/03/22 0258  WBC 19.3* 18.4* 12.0*  NEUTROABS 16.4*  --  9.5*  HGB 11.7* 11.4* 10.6*  HCT 33.3* 34.6* 32.2*  MCV 92.8 96.9 97.3  PLT 253 229 428   Basic Metabolic Panel:  Recent Labs  Lab 03/02/22 0119 03/03/22 0258  NA 138 139  K 4.4 3.9  CL 109 113*  CO2 20* 21*  GLUCOSE 118* 104*  BUN 28* 27*  CREATININE 1.50* 1.33*  CALCIUM 9.3 8.5*  MG 2.1  --   PHOS 2.6  --    Lipid Panel:  Recent Labs  Lab 03/01/22 1839  CHOL 169  TRIG 67  HDL 37*  CHOLHDL 4.6  VLDL 13  LDLCALC 119*   HgbA1c:  Recent Labs  Lab 03/01/22 1839  HGBA1C 5.3   Urine Drug Screen:  Recent Labs  Lab 03/01/22 2131  LABOPIA NONE DETECTED  COCAINSCRNUR NONE DETECTED  LABBENZ NONE DETECTED  AMPHETMU NONE DETECTED  THCU NONE DETECTED  LABBARB NONE DETECTED    Alcohol Level  Recent Labs  Lab 03/01/22 1135  ETH <10    IMAGING past 24 hours VAS US CAROTID  Result Date: 03/03/2022 Carotid Arterial Duplex Study Patient Name:  AZARIAN STARACE  Date of Exam:   03/03/2022 Medical Rec #: [id number]          Accession #:    [id number] Date of Birth: 07/14/1940          Patient Gender: M Patient Age:   81 years Exam Location:  [facility] Procedure:      VAS US CAROTID Referring Phys: Lesleigh Noe --------------------------------------------------------------------------------   Indications:       CVA. Risk Factors:      None. Comparison Study:  No prior studies. Performing Technologist: Oliver Hum RVT  Examination Guidelines: A complete evaluation includes B-mode imaging, spectral Doppler, color Doppler, and power Doppler as needed of all accessible portions of each vessel. Bilateral testing is considered an integral part of a complete examination. Limited examinations for reoccurring indications may be performed as noted.  Right Carotid Findings: +----------+--------+--------+--------+-----------------------+--------+           PSV cm/sEDV cm/sStenosisPlaque Description     Comments +----------+--------+--------+--------+-----------------------+--------+ CCA Prox  67      16              smooth and heterogenoustortuous +----------+--------+--------+--------+-----------------------+--------+ CCA Distal52      21              smooth and heterogenous         +----------+--------+--------+--------+-----------------------+--------+ ICA Prox  29      12              smooth and heterogenous         +----------+--------+--------+--------+-----------------------+--------+  ICA Distal38      20                                     tortuous +----------+--------+--------+--------+-----------------------+--------+ ECA       62      11                                              +----------+--------+--------+--------+-----------------------+--------+ +----------+--------+-------+--------+-------------------+           PSV cm/sEDV cmsDescribeArm Pressure (mmHG) +----------+--------+-------+--------+-------------------+ [id number]                                         +----------+--------+-------+--------+-------------------+ +---------+--------+--+--------+--+---------+ VertebralPSV cm/s36EDV cm/s14Antegrade +---------+--------+--+--------+--+---------+  Left Carotid Findings:  +----------+--------+--------+--------+-----------------------+--------+           PSV cm/sEDV cm/sStenosisPlaque Description     Comments +----------+--------+--------+--------+-----------------------+--------+ CCA Prox  50      13              smooth and heterogenous         +----------+--------+--------+--------+-----------------------+--------+ CCA Distal68      24              smooth and heterogenous         +----------+--------+--------+--------+-----------------------+--------+ ICA Prox  61      20              smooth and heterogenoustortuous +----------+--------+--------+--------+-----------------------+--------+ ICA Distal31      15                                     tortuous +----------+--------+--------+--------+-----------------------+--------+ ECA       63      12                                              +----------+--------+--------+--------+-----------------------+--------+ +----------+--------+--------+--------+-------------------+           PSV cm/sEDV cm/sDescribeArm Pressure (mmHG) +----------+--------+--------+--------+-------------------+ [id number]                                          +----------+--------+--------+--------+-------------------+ +---------+--------+--+--------+-+---------+ VertebralPSV cm/s27EDV cm/s8Antegrade +---------+--------+--+--------+-+---------+   Summary: Right Carotid: Velocities in the right ICA are consistent with a 1-39% stenosis. Left Carotid: Velocities in the left ICA are consistent with a 1-39% stenosis. Vertebrals: Bilateral vertebral arteries demonstrate antegrade flow. *See table(s) above for measurements and observations.     Preliminary    MR ANGIO HEAD WO CONTRAST  Result Date: 03/03/2022 CLINICAL DATA:  Stroke/TIA. Determine embolic source. Right MCA territory stroke. EXAM: MRA HEAD WITHOUT CONTRAST TECHNIQUE: Angiographic images of the Circle of Willis were acquired using MRA  technique without intravenous contrast. COMPARISON:  MRI 2 days ago.  MR angiography 12/06/2015. FINDINGS: Anterior circulation: Both internal carotid arteries are patent through the skull base and siphon regions. On the left, the anterior and middle cerebral vessels appear normal. On the right, there is flow in the  anterior and middle cerebral vessels, though the middle cerebral artery appears somewhat narrow and irregular compared to the left and compared to the study of 2017. This could be recanalized from previous embolic occlusion. Question aneurysm of the left anterior cerebral artery in the proximal pericallosal region. This area suffers from significant motion degradation. CT angiography recommended for clarification. Posterior circulation: Motion degradation in the lower portion of the exam except valuation of the distal vertebral artery suboptimal. Both do show antegrade flow to the basilar artery. No basilar stenosis. Superior cerebellar and posterior cerebral arteries show flow. Right PCA receives most of it supply from the anterior circulation. Some stenoses are visible within the PCA branches, right more than left. Anatomic variants: None other significant. Other: None. IMPRESSION: 1. Motion degraded study. No evidence of large vessel occlusion. 2. The right middle cerebral artery appears somewhat irregular and narrowed compared to the left and compared to the study of 2017. This could be recanalized from previous embolic occlusion. 3. Some stenoses of the PCA branches, right more than left. Right PCA receives most of it supply from the anterior circulation. 4. Cannot rule out small aneurysm of the left. Close all artery. Unfortunately, there is motion degradation in this region. Consider CT angiography for clarification. Electronically Signed   By: Nelson Chimes M.D.   On: 03/03/2022 08:35    PHYSICAL EXAM General: in no acute distress HEENT: normocephalic Cardiovascular:   tachycardic Respiratory: normal respiratory effort and on 2L [place] Gastrointestinal: non-tender and non-distended Extremities:  moving R side UE and LE spontaneously  Mental Status: [place] is alert; he is oriented to person. Speech was slurred. He was able to follow only simple commands intermittently. Mild left-sided neglect Cranial Nerves: II:  R pupil equal, round, reactive to light and accommodation, L eye shut with notable crusting III,IV, VI: notable right gaze preference.  Able to look to the left past midline V,VII: smile asymmetric with notable droop on L side  Motor: Right : Upper extremity   5/5    Left:     Upper extremity   1/5  Lower extremity   5/5     Lower extremity   1/5  ASSESSMENT/PLAN Mr. Meir Elwood is a 81 y.o. male with past medical history of prior CVA, afib reportedly on anticoagulation but compliance unclear, HLD, GERD, and alzheimers and vascular dementia presenting as a level 1 trauma (fall on thinners) on 03/01/2022 with right MCA infarct on imaging  #R MCA stroke In the setting of atrial fibrillation with questionable compliance on anticoagulation  Code stroke CT head shows subcutaneous hematoma in the frontal and left parietal scalp but no acute intracranial abnormality. 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 Small vessel disease present. Atrophy present.  MRI shows acute/subacute right MCA territory infarct involving the right caudate head internal capsule and lentiform nucleus as well as more diffusely over the right temporal and parietal lobe as well as the posterior right frontal lobe, areas of susceptibility within the posterior right lentiform nucleus and caudate head consistent with petechial hemorrhage, and remote lacunar infarcts of L cerebellum MRA is motion degraded study. No evidence of large vessel occlusion, right middle cerebral artery appears somewhat irregular and narrowed compared to the left and compared to the study of  2017. Some stenoses of the PCA branches, right more than left. Right PCA receives most of it supply from the anterior circulation. Carotid Doppler shows velocities in right ICA and left ICA consistent with  1 - 39% stenosis 2D Echo not indicated at this time given known a-fib LDL 119, fasting lipid panel ordered HgbA1c 5.3 VTE prophylaxis - SCDs    Diet   Diet NPO time specified   Prescribed apixaban but unknown if taking  prior to admission, now on aspirin 81 mg daily pending swallow eval. Repeat imaging in 1 week prior to restarting apixaban Therapy recommendations: acute inpatient rehab Disposition:  pending medical improvement  #Hyperlipidemia Home meds:  none LDL 119, goal < 70 Recheck fasting lipids High intensity statin not indicated at this time until fasting lipids confirmed  Other Active Problems to be managed by primary team #Atrial fibrillation, rate controlled  #AKI #Rhabdomyolysis #Hematemesis #Abdominal aortic aneurysm  #Hx of mixed Alzheimer and vascular dementia #Goals of Care   Hospital day # 1  I have personally obtained history,examined this patient, reviewed notes, independently viewed imaging studies, participated in medical decision making and plan of care.ROS completed by me personally and pertinent positives fully documented  I have made any additions or clarifications directly to the above note. Agree with note above.  Patient presented with right MCA infarct with left hemiplegia secondary to A-fib while not on anticoagulation.  Patient counseled to be compliant with taking his anticoagulants.  He is undergoing upper endoscopy today and if there is no obvious contraindications may recommend anticoagulation after the procedure if patient and family are willing.  Greater than 50% time during this 35-minute visit was spent in counseling and coordination of care about his embolic stroke and discussion about risk benefit of anticoagulation and stroke prevention and  answering questions  Antony Contras, MD Medical Director [place] Pager: [phone] 03/03/2022 4:41 PM  To contact Stroke Continuity provider, please refer to [email or web address] After hours, contact General Neurology

## 2022-03-03 NOTE — Progress Notes (Signed)
PT Cancellation Note  Patient Details Name: [name] [name] MRN: [id number] DOB: 02-07-1941   Cancelled Treatment:    Reason Eval/Treat Not Completed: Patient at procedure or test/unavailable.  Will reassess ability to have PT after endoscopy as time and pt allow.   Ramond Dial 03/03/2022, 10:34 AM  Mee Hives, PT PhD Acute Rehab Dept. Number: [place]

## 2022-03-03 NOTE — Transfer of Care (Signed)
Immediate Anesthesia Transfer of Care Note  Patient: [name] [name]  Procedure(s) Performed: ESOPHAGOGASTRODUODENOSCOPY (EGD) WITH PROPOFOL BIOPSY  Patient Location: PACU  Anesthesia Type:MAC  Level of Consciousness: drowsy  Airway & Oxygen Therapy: Patient Spontanous Breathing and Patient connected to nasal cannula oxygen  Post-op Assessment: Report given to RN  Post vital signs: Reviewed and stable  Last Vitals:  Vitals Value Taken Time  BP 105/74 03/03/22 1012  Temp    Pulse 109 03/03/22 1014  Resp 23 03/03/22 1014  SpO2 95 % 03/03/22 1014  Vitals shown include unvalidated device data.  Last Pain:  Vitals:   03/03/22 0854  TempSrc: Temporal  PainSc: 0-No pain         Complications: No notable events documented.

## 2022-03-03 NOTE — Evaluation (Signed)
Clinical/Bedside Swallow Evaluation Patient Details  Name: [name] [name] MRN: [id number] Date of Birth: 08/18/1940  Today's Date: 03/03/2022 Time: SLP Start Time (ACUTE ONLY): 35 SLP Stop Time (ACUTE ONLY): 0254 SLP Time Calculation (min) (ACUTE ONLY): 14 min  Past Medical History:  Past Medical History:  Diagnosis Date   Arthritis    Cataracts, bilateral    Chickenpox    Dyslipidemia    GERD (gastroesophageal reflux disease)    Rheumatoid arthritis ([place])    Past Surgical History:  Past Surgical History:  Procedure Laterality Date   CATARACT EXTRACTION Bilateral 2011; 2013   COLONOSCOPY WITH PROPOFOL N/A 04/06/2015   Procedure: COLONOSCOPY WITH PROPOFOL;  Surgeon: Josefine Class, MD;  Location[facility] ENDOSCOPY;  Service: Endoscopy;  Laterality: N/A;   TONSILLECTOMY     TRIGGER FINGER RELEASE Left 10/22/12   Thumb   HPI:  Pt is an 82 y/o M presenting to ED on 12/2 after ground level fall. LUE negative for fractures, MRI revealing acute/subacute R MCA territory infarct involving R caudate head internal capsule, and lentiform nucleus and diffusely over R temporal, parietal, and posterior frontal lobes. MRI C spine revealing prevertebral fluid from skull base through C6-7 without adjacent bone edema. PMH includes A fib, HLD, GERD, and dementia. Son, [name] [name], says that pt's cognition is impaired at baseline but that he can eat and drink normally.    Assessment / Plan / Recommendation  Clinical Impression  SLP spoke with pt's son, [name] [name], via phone prior to evaluation. He says that when he was with pt earlier today, he appeared to be at his cognitive and communicative baseline, and typically he is able to eat and drink normally for the most part other than c/o of his false teeth sometimes (note that he declines speech/language eval at this time given that he believes his dad is at his baseline from this standpoint). Pt is sleepy upon SLP arrival, becoming more alert with ongoing  stimulation, but still presenting with some decreased oral awareness when it comes to bolus acceptance. Once in his mouth he seems to masticate and swallow more swiftly. He only takes tiny nibbles off of crackers at a time, but drinks liquids a little more impulsively. Coughing was noted x1 after thin liquids in larger volumes and eructation was noted intermittently. Recommend starting home diet of regular solids and thin liquids as [name] also shares that pt's mentation begins to decline at home if he does not get adequate POs. Will start soft/low fiber diet per GI and thin liquids. SLP will continue to follow, anticipating that pt will need supervision during meals from a cognitive standpoint. SLP Visit Diagnosis: Dysphagia, unspecified (R13.10)    Aspiration Risk  Mild aspiration risk;Moderate aspiration risk    Diet Recommendation Regular;Thin liquid ("soft/low fiber" per GI)   Liquid Administration via: Cup;Straw Medication Administration: Crushed with puree Supervision: Staff to assist with self feeding;Full supervision/cueing for compensatory strategies Compensations: Minimize environmental distractions;Slow rate;Small sips/bites Postural Changes: Seated upright at 90 degrees;Remain upright for at least 30 minutes after po intake    Other  Recommendations Oral Care Recommendations: Oral care BID    Recommendations for follow up therapy are one component of a multi-disciplinary discharge planning process, led by the attending physician.  Recommendations may be updated based on patient status, additional functional criteria and insurance authorization.  Follow up Recommendations  (tba)      Assistance Recommended at Discharge    Functional Status Assessment Patient has had a recent decline in  their functional status and demonstrates the ability to make significant improvements in function in a reasonable and predictable amount of time.  Frequency and Duration min 2x/week  2 weeks        Prognosis Prognosis for Safe Diet Advancement: Good      Swallow Study   General HPI: Pt is an 81 y/o M presenting to ED on 12/2 after ground level fall. LUE negative for fractures, MRI revealing acute/subacute R MCA territory infarct involving R caudate head internal capsule, and lentiform nucleus and diffusely over R temporal, parietal, and posterior frontal lobes. MRI C spine revealing prevertebral fluid from skull base through C6-7 without adjacent bone edema. PMH includes A fib, HLD, GERD, and dementia. Son, [name] [name], says that pt's cognition is impaired at baseline but that he can eat and drink normally. Type of Study: Bedside Swallow Evaluation Previous Swallow Assessment: none in chart Diet Prior to this Study: NPO Temperature Spikes Noted: No Respiratory Status: Nasal cannula History of Recent Intubation: No Behavior/Cognition: Cooperative;Lethargic/Drowsy;Requires cueing (sleepy, but awakened with ongoing stimulation) Oral Cavity Assessment:  (limited assessment but appears to be [facility]) Oral Care Completed by SLP: No Oral Cavity - Dentition: Dentures, top;Dentures, bottom Vision: Impaired for self-feeding (appears to have L neglect) Self-Feeding Abilities: Total assist Patient Positioning: Upright in bed Baseline Vocal Quality: Normal    Oral/Motor/Sensory Function Overall Oral Motor/Sensory Function:  (difficulty following commands to assess but definitely note L facial droop)   Ice Chips Ice chips: Within functional limits Presentation: Spoon   Thin Liquid Thin Liquid: Impaired Presentation: Straw Pharyngeal  Phase Impairments: Cough - Immediate    Nectar Thick Nectar Thick Liquid: Not tested   Honey Thick Honey Thick Liquid: Not tested   Puree Puree: Impaired Presentation: Spoon Oral Phase Impairments: Poor awareness of bolus   Solid     Solid: Impaired Oral Phase Impairments: Poor awareness of bolus      Osie Bond., M.A. [place] Office  [phone]  Secure chat preferred  03/03/2022,4:27 PM

## 2022-03-03 NOTE — Procedures (Signed)
[facility]  Routine EEG Report  Video Performed: Not performed  Demographics: Patient Name:   [name] [name], [name] [name]  Date of Birth:   10/21/1940  Identification Number:   MRN - [id number]  Study Times:  Study Start Time:   03/03/2022 14:12:00 Study End Time:   03/03/2022 15:03:00  Indication(s): Spells, Eval for Seizures, Encephalopathy  Technical Summary:  This EEG was performed utilizing standard International 10-20 System of electrode placement. One channel electrocardiogram was monitored. Data were obtained, stored, and interpreted utilizing referential montage recording, with reformatting to longitudinal, transverse bipolar, and referential montages as necessary for interpretation.  State(s):      Drowsy      Asleep   Activation Procedures: Hyperventilation: Not performed Photic Stimulation: Not performed   EEG Description:  The patient appeared somnolent, the background showed a reduction of posterior dominant alpha rhythm. There were continuous of low amplitude diffuse 6-7 Hz theta activity noted.  The amplitudes were low at 20 uV.  There were reductions of physiologic sleep structures observed.  There was no clinical event noted.  Throughout the record, there was no epileptiform abnormality or lateralizing sign observed.  Intermittent myogenic and movement artifacts were noted.  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 This is non-specific in etiology.  No epileptiform abnormality, electrographic seizures or evidence of status epilepticus were present. No lateralizing sign is observed.    Dr Spero Curb      [facility] For Inpatient follow-up with [facility] physician please call [facility] [phone]. This is not an outpatient service. Post hospital discharge, please contact hospital directly.

## 2022-03-03 NOTE — Anesthesia Postprocedure Evaluation (Signed)
Anesthesia Post Note  Patient: [name] [name]  Procedure(s) Performed: ESOPHAGOGASTRODUODENOSCOPY (EGD) WITH PROPOFOL BIOPSY     Patient location during evaluation: Endoscopy Anesthesia Type: MAC Level of consciousness: oriented, awake and alert and awake Pain management: pain level controlled Vital Signs Assessment: post-procedure vital signs reviewed and stable Respiratory status: spontaneous breathing, nonlabored ventilation, respiratory function stable and patient connected to nasal cannula oxygen Cardiovascular status: blood pressure returned to baseline, stable and tachycardic Postop Assessment: no headache, no backache and no apparent nausea or vomiting Anesthetic complications: no   No notable events documented.  Last Vitals:  Vitals:   03/03/22 1030 03/03/22 1040  BP: 114/84 120/81  Pulse: (!) 108 100  Resp: (!) 23 (!) 22  Temp:  37 C  SpO2: 98% 98%    Last Pain:  Vitals:   03/03/22 1040  TempSrc:   PainSc: [place]

## 2022-03-03 NOTE — Progress Notes (Signed)
EEG complete - results pending 

## 2022-03-03 NOTE — Evaluation (Signed)
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 MRI C spine revealing prevertebral fluid from skull base through C6-7 without adjacent bone edema. PMH includes A fib, HLD, GERD, and dementia.  Clinical Impression  Pt was seen for progression of mobility to side of bed with two person help, using pillows for support on LUE and cues for management of pusher syndrome.  Pt is inconsistent of use of core mm's to control sit balance but will recommend him to continue to work on mobility toward an admission to AIR if able to be accepted.  If not will defer to SNF level of care as pt is from home living independently and will need significant support to regain active mobility.  Follow along with pt for strengthening, balance  training, L side facilitation of WB and progression to standing as his condition permits.       Recommendations for follow up therapy are one component of a multi-disciplinary discharge planning process, led by the attending physician.  Recommendations may be updated based on patient status, additional functional criteria and insurance authorization.  Follow Up Recommendations Acute inpatient rehab (3hours/day)      Assistance Recommended at Discharge Frequent or constant Supervision/Assistance  Patient can return home with the following  Two people to help with walking and/or transfers;A lot of help with bathing/dressing/bathroom;Assistance with cooking/housework;Direct supervision/assist for medications management;Direct supervision/assist for financial management;Assist for transportation;Help with  stairs or ramp for entrance    Equipment Recommendations None recommended by PT  Recommendations for Other Services  Rehab consult    Functional Status Assessment Patient has had a recent decline in their functional status and demonstrates the ability to make significant improvements in function in a reasonable and predictable amount of time.     Precautions / Restrictions Precautions Precautions: Fall;Cervical Precaution Booklet Issued: No Precaution Comments: L hemiplegia Required Braces or Orthoses: Cervical Brace Cervical Brace: Soft collar;For comfort;At all times Restrictions Weight Bearing Restrictions: No      Mobility  Bed Mobility Overal bed mobility: Needs Assistance Bed Mobility: Rolling, Sidelying to Sit, Sit to Sidelying Rolling: Max assist Sidelying to sit: Max assist, +2 for physical assistance, +2 for safety/equipment     Sit to sidelying: Max assist, +2 for physical assistance, +2 for safety/equipment General bed mobility comments: used bed pad to protect neck and maintain alighnment    Transfers Overall transfer level: Needs assistance                 General transfer comment: deferred due to poor control of trunk in sitting and LLE flexion posture and tone    Ambulation/Gait               General Gait Details: unable  Stairs            Wheelchair Mobility    Modified Rankin (Stroke Patients Only)       Balance Overall balance assessment: Needs assistance, History of Falls Sitting-balance support: Feet supported, Single extremity supported Sitting balance-Leahy Scale: Poor Sitting balance - Comments: tends to push on RUE to lean L and posteriorly  Pertinent Vitals/Pain Pain Assessment Pain Assessment: Faces Faces Pain Scale: Hurts little more Pain Location: L hip to IR Pain Descriptors / Indicators: Discomfort, Grimacing, Guarding Pain Intervention(s): Limited  activity within patient's tolerance, Monitored during session, Repositioned    Home Living Family/patient expects to be discharged to:: Private residence Living Arrangements: Alone Available Help at Discharge: Friend(s);Family;Available PRN/intermittently Type of Home: House           Home Equipment: None      Prior Function Prior Level of Function : Needs assist       Physical Assist : Mobility (physical) Mobility (physical): Gait   Mobility Comments: no AD use ADLs Comments: help with meals and transportation     Hand Dominance   Dominant Hand: Right    Extremity/Trunk Assessment   Upper Extremity Assessment Upper Extremity Assessment: Defer to OT evaluation RUE Deficits / Details: global weakness but WFL , able to flex shoulder, bring hand to mouth and grasp WFL RUE Sensation: WNL RUE Coordination: WNL LUE Deficits / Details: flaccid, no AROM, PROM WFL LUE Sensation: decreased light touch;decreased proprioception LUE Coordination: decreased gross motor;decreased fine motor    Lower Extremity Assessment Lower Extremity Assessment: LLE deficits/detail LLE Deficits / Details: Pt has minimal active use of LLE, driven by flexion tone and worsened with discomfort and with flexed trunk posture LLE:  (flexion driven tone with no active extension elicited) LLE Coordination: decreased gross motor    Cervical / Trunk Assessment Cervical / Trunk Assessment: Kyphotic  Communication   Communication: Expressive difficulties (has residual stroke issues)  Cognition Arousal/Alertness: Awake/alert Behavior During Therapy: WFL for tasks assessed/performed Overall Cognitive Status: History of cognitive impairments - at baseline                                 General Comments: Pt has been living alone but has cognitive changes from stroke history with pt requiring extra time and repetition of instructions        General Comments General comments (skin  integrity, edema, etc.): Pt is inattentive to LLE in sitting with R gaze and apparent limited L side vision.    Exercises     Assessment/Plan    PT Assessment Patient needs continued PT services  PT Problem List Decreased strength;Decreased range of motion;Decreased activity tolerance;Decreased balance;Decreased mobility;Decreased coordination;Decreased cognition;Decreased safety awareness;Cardiopulmonary status limiting activity;Decreased skin integrity;Pain       PT Treatment Interventions DME instruction;Gait training;Functional mobility training;Therapeutic activities;Therapeutic exercise;Balance training;Neuromuscular re-education;Patient/family education    PT Goals (Current goals can be found in the Care Plan section)  Acute Rehab PT Goals Patient Stated Goal: none stated PT Goal Formulation: With family Time For Goal Achievement: 03/17/22 Potential to Achieve Goals: Fair    Frequency Min 4X/week     Co-evaluation PT/OT/SLP Co-Evaluation/Treatment: Yes Reason for Co-Treatment: Complexity of the patient's impairments (multi-system involvement);For patient/therapist safety;To address functional/ADL transfers PT goals addressed during session: Mobility/safety with mobility;Balance         AM-PAC PT "6 Clicks" Mobility  Outcome Measure Help needed turning from your back to your side while in a flat bed without using bedrails?: A Lot Help needed moving from lying on your back to sitting on the side of a flat bed without using bedrails?: Total Help needed moving to and from a bed to a chair (including a wheelchair)?: Total Help needed standing up from a chair using your arms (e.g., wheelchair or bedside chair)?: Total  Help needed to walk in hospital room?: Total Help needed climbing 3-5 steps with a railing? : Total 6 Click Score: 7    End of Session Equipment Utilized During Treatment: Cervical collar Activity Tolerance: Patient limited by fatigue;Treatment limited  secondary to medical complications (Comment);Patient limited by pain Patient left: in bed;with call bell/phone within reach;with bed alarm set;with family/visitor present Nurse Communication: Mobility status PT Visit Diagnosis: Muscle weakness (generalized) (M62.81);Difficulty in walking, not elsewhere classified (R26.2);Pain;Hemiplegia and hemiparesis;Other symptoms and signs involving the nervous system (R29.898) Hemiplegia - Right/Left: Left Hemiplegia - dominant/non-dominant: Non-dominant Hemiplegia - caused by: Cerebral infarction Pain - Right/Left: Left Pain - part of body: Arm;Hip    Time: 8182-9937 (+ [id number]) PT Time Calculation (min) (ACUTE ONLY): 29 min   Charges:   PT Evaluation $PT Eval Moderate Complexity: 1 Mod         Ramond Dial 03/03/2022, 2:59 PM  Mee Hives, PT PhD Acute Rehab Dept. Number: [place] [facility] [place]

## 2022-03-03 NOTE — Anesthesia Procedure Notes (Signed)
Procedure Name: MAC Date/Time: 03/03/2022 9:54 AM  Performed by: Barrington Ellison, CRNAPre-anesthesia Checklist: Patient identified, Emergency Drugs available, Suction available, Patient being monitored and Timeout performed Patient Re-evaluated:Patient Re-evaluated prior to induction Oxygen Delivery Method: Nasal cannula

## 2022-03-03 NOTE — Progress Notes (Signed)
SLP Cancellation Note  Patient Details Name: [name] [name] MRN: [id number] DOB: November 08, 1940   Cancelled treatment:       Reason Eval/Treat Not Completed: Patient at procedure or test/unavailable   Osie Bond., M.A. [place] Office [phone]  Secure chat preferred  03/03/2022, 9:45 AM

## 2022-03-03 NOTE — Anesthesia Preprocedure Evaluation (Addendum)
Anesthesia Evaluation  Patient identified by MRN, date of birth, ID band Patient awake    Reviewed: Allergy & Precautions, NPO status , Patient's Chart, lab work & pertinent test results  Airway Mallampati: II  TM Distance: <3 FB Neck ROM: Full    Dental  (+) Dental Advisory Given, Poor Dentition   Pulmonary former smoker   Pulmonary exam normal breath sounds clear to auscultation       Cardiovascular hypertension, Pt. on medications + dysrhythmias Atrial Fibrillation  Rhythm:Irregular Rate:Abnormal     Neuro/Psych       Dementia CVA    GI/Hepatic Neg liver ROS,GERD  Medicated,,Coffee-ground emesis   Endo/Other  negative endocrine ROS    Renal/GU Renal InsufficiencyRenal disease (AKI)     Musculoskeletal  (+) Arthritis , Rheumatoid disorders,    Abdominal   Peds  Hematology  (+) Blood dyscrasia (Eliquis), anemia   Anesthesia Other Findings Day of surgery medications reviewed with the patient.  Reproductive/Obstetrics                             Anesthesia Physical Anesthesia Plan  ASA: 4  Anesthesia Plan: MAC   Post-op Pain Management: Minimal or no pain anticipated   Induction: Intravenous  PONV Risk Score and Plan: 1 and TIVA and Treatment may vary due to age or medical condition  Airway Management Planned: Natural Airway and Simple Face Mask  Additional Equipment:   Intra-op Plan:   Post-operative Plan:   Informed Consent: I have reviewed the patients History and Physical, chart, labs and discussed the procedure including the risks, benefits and alternatives for the proposed anesthesia with the patient or authorized representative who has indicated his/her understanding and acceptance.   Patient has DNR.  Discussed DNR with power of attorney and Suspend DNR.   Dental advisory given  Plan Discussed with: CRNA  Anesthesia Plan Comments:        Anesthesia Quick  Evaluation

## 2022-03-03 NOTE — Evaluation (Signed)
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 MRI C spine revealing prevertebral fluid from skull base through C6-7 without adjacent bone edema. PMH includes A fib, HLD, GERD, and dementia.   Clinical Impression   PTA, pt lived alone, independent with ADLs and mobility, family assisted PRN for IADLs/transportation. Family is working to increase support at home for pt upon d/c. Pt currently needing max-total A for ADLs, max +2 for bed mobility, needing mod posterior and L side support to maintain sitting balance at EOB. Pt with flaccid LUE, educated family on PROM of LUE along with supportive positioning with use of pillows. Pt with L visual neglect, does not track to L side when cued, or follow stimuli past midline. HR up to 157bpm during session. Pt presenting with impairments listed below, will follow acutely. Recommend AIR at d/c.     Recommendations for follow up therapy are one component of a multi-disciplinary discharge planning process, led by the attending physician.  Recommendations may be updated based on patient status, additional functional criteria and insurance authorization.   Follow Up Recommendations  Acute inpatient rehab (3hours/day)     Assistance Recommended at Discharge Frequent or constant Supervision/Assistance  Patient can return home with the following Two people to help with walking and/or transfers;A lot of help with bathing/dressing/bathroom;Assistance with cooking/housework;Assistance with feeding;Direct supervision/assist for medications management;Direct supervision/assist for  financial management;Assist for transportation;Help with stairs or ramp for entrance    Functional Status Assessment  Patient has had a recent decline in their functional status and demonstrates the ability to make significant improvements in function in a reasonable and predictable amount of time.  Equipment Recommendations  None recommended by OT (defer)    Recommendations for Other Services PT consult;Rehab consult     Precautions / Restrictions Precautions Precautions: Fall;Cervical Precaution Booklet Issued: No Precaution Comments: L hemiplegia Required Braces or Orthoses: Cervical Brace Cervical Brace: Soft collar;At all times Restrictions Weight Bearing Restrictions: No      Mobility Bed Mobility Overal bed mobility: Needs Assistance Bed Mobility: Rolling, Sidelying to Sit, Sit to Sidelying Rolling: Max assist Sidelying to sit: Max assist, +2 for physical assistance     Sit to sidelying: Max assist, +2 for physical assistance General bed mobility comments: assist to follow log roll technique    Transfers                   General transfer comment: deferred      Balance Overall balance assessment: Needs assistance Sitting-balance support: Feet supported, Single extremity supported Sitting balance-Leahy Scale: Poor Sitting balance - Comments: posterior and L lateral lean, pushes with RUE                                   ADL either performed or assessed with clinical judgement   ADL Overall ADL's : Needs assistance/impaired Eating/Feeding: NPO   Grooming: Maximal assistance;Moderate assistance   Upper Body Bathing: Maximal assistance   Lower Body Bathing: Maximal assistance   Upper Body Dressing : Total assistance   Lower Body Dressing: Total assistance   Toilet  Transfer: Maximal assistance;+2 for physical assistance   Toileting- Clothing Manipulation and Hygiene: Total assistance       Functional mobility during ADLs:  Maximal assistance;+2 for physical assistance       Vision   Vision Assessment?: Vision impaired- to be further tested in functional context Additional Comments: pt with appearing L neglect, able to identify numbers correctly 3/4 times when held in L visual field, R gaze perference     Perception Perception Perception Tested?: No   Praxis Praxis Praxis tested?: Not tested    Pertinent Vitals/Pain Pain Assessment Pain Assessment: Faces Pain Score: 4  Faces Pain Scale: Hurts little more Pain Location: LLE with mobility Pain Descriptors / Indicators: Discomfort, Grimacing Pain Intervention(s): Limited activity within patient's tolerance, Monitored during session, Repositioned     Hand Dominance Right   Extremity/Trunk Assessment Upper Extremity Assessment Upper Extremity Assessment: LUE deficits/detail;RUE deficits/detail;Generalized weakness RUE Deficits / Details: global weakness but WFL , able to flex shoulder, bring hand to mouth and grasp WFL RUE Sensation: WNL RUE Coordination: WNL LUE Deficits / Details: flaccid, no AROM, PROM WFL LUE Sensation: decreased light touch;decreased proprioception LUE Coordination: decreased gross motor;decreased fine motor   Lower Extremity Assessment Lower Extremity Assessment: Defer to PT evaluation   Cervical / Trunk Assessment Cervical / Trunk Assessment: Kyphotic (pt with soft collar on)   Communication Communication Communication: Expressive difficulties (at times nonsensical speech, per family report pt's speech has been affected from prior CVAs)   Cognition Arousal/Alertness: Awake/alert Behavior During Therapy: WFL for tasks assessed/performed Overall Cognitive Status: History of cognitive impairments - at baseline                                 General Comments: hx of dementia, increased command following as pt became more alert, following commands inconsistently and with increased time; oriented to self  only     General Comments  HR up to 157bpm with bed mobility/seated EOB    Exercises     Shoulder Instructions      Home Living Family/patient expects to be discharged to:: Private residence Living Arrangements: Alone Available Help at Discharge: Friend(s);Family;Available PRN/intermittently Type of Home: House                                  Prior Functioning/Environment Prior Level of Function : Needs assist             Mobility Comments: no AD use ADLs Comments: needing assist for IADLs/transportation        OT Problem List: Decreased strength;Decreased range of motion;Decreased activity tolerance;Impaired balance (sitting and/or standing);Decreased cognition;Impaired vision/perception;Decreased coordination;Decreased safety awareness;Cardiopulmonary status limiting activity;Impaired UE functional use;Impaired tone;Impaired sensation      OT Treatment/Interventions: Self-care/ADL training;Therapeutic exercise;Energy conservation;DME and/or AE instruction;Therapeutic activities;Patient/family education;Balance training;Visual/perceptual remediation/compensation;Cognitive remediation/compensation;Modalities;Splinting;Manual therapy;Neuromuscular education    OT Goals(Current goals can be found in the care plan section) Acute Rehab OT Goals Patient Stated Goal: none stated OT Goal Formulation: With patient/family Time For Goal Achievement: 03/17/22 Potential to Achieve Goals: Fair ADL Goals Pt Will Perform Upper Body Dressing: with mod assist;sitting Pt Will Perform Lower Body Dressing: with mod assist;sit to/from stand;sitting/lateral leans Pt/caregiver will Perform Home Exercise Program: Increased ROM;Increased strength;Left upper extremity;With written HEP provided;With minimal assist Additional ADL Goal #1: pt will be sit EOB with min A in prep for seated ADLs  OT  Frequency: Min 3X/week    Co-evaluation PT/OT/SLP Co-Evaluation/Treatment: Yes Reason  for Co-Treatment: Complexity of the patient's impairments (multi-system involvement);To address functional/ADL transfers;For patient/therapist safety          AM-PAC OT "6 Clicks" Daily Activity     Outcome Measure Help from another person eating meals?: A Lot (NPO, however has functional ROM to self feed with RUE) Help from another person taking care of personal grooming?: A Lot Help from another person toileting, which includes using toliet, bedpan, or urinal?: Total Help from another person bathing (including washing, rinsing, drying)?: Total Help from another person to put on and taking off regular upper body clothing?: A Lot Help from another person to put on and taking off regular lower body clothing?: Total 6 Click Score: 9   End of Session Equipment Utilized During Treatment: Cervical collar Nurse Communication: Mobility status  Activity Tolerance: Patient tolerated treatment well Patient left: in bed;with call bell/phone within reach;with bed alarm set;with family/visitor present  OT Visit Diagnosis: Unsteadiness on feet (R26.81);Other abnormalities of gait and mobility (R26.89);Muscle weakness (generalized) (M62.81);History of falling (Z91.81);Other symptoms and signs involving cognitive function;Hemiplegia and hemiparesis Hemiplegia - Right/Left: Left Hemiplegia - dominant/non-dominant: Non-Dominant Hemiplegia - caused by: Cerebral infarction                Time: 1120-1149 OT Time Calculation (min): 29 min Charges:  OT General Charges $OT Visit: 1 Visit OT Evaluation $OT Eval Moderate Complexity: 1 Mod  Kennedey Digilio K, OTD, OTR/L SecureChat Preferred Acute Rehab [phone]   Renaye Rakers [place] 03/03/2022, 12:34 PM

## 2022-03-03 NOTE — Interval H&P Note (Signed)
History and Physical Interval Note:  03/03/2022 9:51 AM  [name] [name]  has presented today for surgery, with the diagnosis of Coffee-ground emesis.  The various methods of treatment have been discussed with the patient and family. After consideration of risks, benefits and other options for treatment, the patient has consented to  Procedure(s): ESOPHAGOGASTRODUODENOSCOPY (EGD) WITH PROPOFOL (N/A) as a surgical intervention.  The patient's history has been reviewed, patient examined, no change in status, stable for surgery.  I have reviewed the patient's chart and labs.  Questions were answered to the patient's satisfaction.     Devell Parkerson

## 2022-03-03 NOTE — Progress Notes (Signed)
Patient admitted to medical service with fall and acute CVA. LUE films were negative for fracture. 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 Patient was down for EGD for coffee-ground emesis this AM which showed esophageal ulcer and hiatal hernia. Patient has not yet been evaluated by SLP. No other recommendations in management from a trauma surgery standpoint at this time. Would not generally recommend PEG placement in patient with possible recent UGI bleeding. Trauma will sign off.   Norm Parcel, [facility] Surgery 03/03/2022, 12:55 PM Please see Amion for pager number during day hours 7:00am-4:30pm

## 2022-03-03 NOTE — Hospital Course (Signed)
On anticoagulation or not?

## 2022-03-03 NOTE — Progress Notes (Signed)
OT Cancellation Note  Patient Details Name: [name] [name] MRN: [id number] DOB: 05-18-1940   Cancelled Treatment:    Reason Eval/Treat Not Completed: Patient at procedure or test/ unavailable (pt off unit, will follow up for OT evaluation as schedule permits)  Renaye Rakers, OTD, OTR/L SecureChat Preferred Acute Rehab [phone] - [place] 03/03/2022, 9:25 AM

## 2022-03-03 NOTE — Progress Notes (Signed)
HD#1 Subjective:   Summary: [name] [name] is a 81 y.o. male with PMH of prior CVA, mixed Alzheimer's and vascular dementia, Afib not on anticoagulation who presents after fall and admitted for Afib with RVR and acute right MCA stroke.  Overnight Events: none  Patient responds to some commands today. Able to move his right arm and leg. No further hematemesis. Completed EGD this morning. Son and daughter-in-law present at bedside.    Objective:  Vital signs in last 24 hours: Vitals:   03/03/22 1015 03/03/22 1030 03/03/22 1040 03/03/22 1159  BP: 107/85 114/84 120/81 (!) 121/96  Pulse: (!) 109 (!) 108 100 (!) 118  Resp: (!) 21 (!) 23 (!) 22 20  Temp:   98.6 F (37 C) 97.8 F (36.6 C)  TempSrc:    Oral  SpO2: 95% 98% 98% 98%  Weight:      Height:       Supplemental O2: Room Air SpO2: 98 % O2 Flow Rate (L/min): 2 L/min   Physical Exam:  Constitutional: ill-appearing male, laying in bed, in no acute distress HEENT: normocephalic, crusting around eyes L>R Cardiovascular: irregular rhythm, normal rate Pulmonary/Chest: normal work of breathing on room air Neurological: awake, able to follow some commands on right side with notable left-sided neglect Skin: bruising over left UE and left lateral hip  Filed Weights   03/01/22 1222 03/01/22 1851 03/03/22 0854  Weight: 75 kg 74.1 kg 74.1 kg     Intake/Output Summary (Last 24 hours) at 03/03/2022 1648 Last data filed at 03/03/2022 1008 Gross per 24 hour  Intake 200 ml  Output 400 ml  Net -200 ml   Net IO Since Admission: 26.7 mL [03/03/22 1648]  Pertinent Labs:    Latest Ref Rng & Units 03/03/2022    2:58 AM 03/02/2022   10:51 AM 03/02/2022    1:19 AM  CBC  WBC 4.0 - 10.5 K/uL 12.0  18.4  19.3   Hemoglobin 13.0 - 17.0 g/dL 10.6  11.4  11.7   Hematocrit 39.0 - 52.0 % 32.2  34.6  33.3   Platelets 150 - 400 K/uL 210  229  253        Latest Ref Rng & Units 03/03/2022    2:58 AM 03/02/2022    1:19 AM 03/01/2022    11:41 AM  CMP  Glucose 70 - 99 mg/dL 104  118  144   BUN 8 - 23 mg/dL '27  28  28   '$ Creatinine 0.61 - 1.24 mg/dL 1.33  1.50  1.40   Sodium 135 - 145 mmol/L 139  138  139   Potassium 3.5 - 5.1 mmol/L 3.9  4.4  4.3   Chloride 98 - 111 mmol/L 113  109  106   CO2 22 - 32 mmol/L 21  20    Calcium 8.9 - 10.3 mg/dL 8.5  9.3      Imaging: VAS US CAROTID  Result Date: 03/03/2022 Carotid Arterial Duplex Study Patient Name:  [name] [name]  Date of Exam:   03/03/2022 Medical Rec #: [id number]          Accession #:    [id number] Date of Birth: [birth year]          Patient Gender: M Patient Age:   25 years Exam Location:  [facility] Procedure:      VAS US CAROTID Referring Phys: Lesleigh Noe --------------------------------------------------------------------------------  Indications:       CVA. Risk Factors:  None. Comparison Study:  No prior studies. Performing Technologist: Oliver Hum RVT  Examination Guidelines: A complete evaluation includes B-mode imaging, spectral Doppler, color Doppler, and power Doppler as needed of all accessible portions of each vessel. Bilateral testing is considered an integral part of a complete examination. Limited examinations for reoccurring indications may be performed as noted.  Right Carotid Findings: +----------+--------+--------+--------+-----------------------+--------+           PSV cm/sEDV cm/sStenosisPlaque Description     Comments +----------+--------+--------+--------+-----------------------+--------+ CCA Prox  67      16              smooth and heterogenoustortuous +----------+--------+--------+--------+-----------------------+--------+ CCA Distal52      21              smooth and heterogenous         +----------+--------+--------+--------+-----------------------+--------+ ICA Prox  29      12              smooth and heterogenous         +----------+--------+--------+--------+-----------------------+--------+ ICA Distal38       20                                     tortuous +----------+--------+--------+--------+-----------------------+--------+ ECA       62      11                                              +----------+--------+--------+--------+-----------------------+--------+ +----------+--------+-------+--------+-------------------+           PSV cm/sEDV cmsDescribeArm Pressure (mmHG) +----------+--------+-------+--------+-------------------+ [id number]                                         +----------+--------+-------+--------+-------------------+ +---------+--------+--+--------+--+---------+ VertebralPSV cm/s36EDV cm/s14Antegrade +---------+--------+--+--------+--+---------+  Left Carotid Findings: +----------+--------+--------+--------+-----------------------+--------+           PSV cm/sEDV cm/sStenosisPlaque Description     Comments +----------+--------+--------+--------+-----------------------+--------+ CCA Prox  50      13              smooth and heterogenous         +----------+--------+--------+--------+-----------------------+--------+ CCA Distal68      24              smooth and heterogenous         +----------+--------+--------+--------+-----------------------+--------+ ICA Prox  61      20              smooth and heterogenoustortuous +----------+--------+--------+--------+-----------------------+--------+ ICA Distal31      15                                     tortuous +----------+--------+--------+--------+-----------------------+--------+ ECA       63      12                                              +----------+--------+--------+--------+-----------------------+--------+ +----------+--------+--------+--------+-------------------+           PSV cm/sEDV cm/sDescribeArm Pressure (mmHG) +----------+--------+--------+--------+-------------------+ [id number]                                           +----------+--------+--------+--------+-------------------+ +---------+--------+--+--------+-+---------+  VertebralPSV cm/s27EDV cm/s8Antegrade +---------+--------+--+--------+-+---------+   Summary: Right Carotid: Velocities in the right ICA are consistent with a 1-39% stenosis. Left Carotid: Velocities in the left ICA are consistent with a 1-39% stenosis. Vertebrals: Bilateral vertebral arteries demonstrate antegrade flow. *See table(s) above for measurements and observations.     Preliminary    MR ANGIO HEAD WO CONTRAST  Result Date: 03/03/2022 CLINICAL DATA:  Stroke/TIA. Determine embolic source. Right MCA territory stroke. EXAM: MRA HEAD WITHOUT CONTRAST TECHNIQUE: Angiographic images of the Circle of Willis were acquired using MRA technique without intravenous contrast. COMPARISON:  MRI 2 days ago.  MR angiography 12/06/2015. FINDINGS: Anterior circulation: Both internal carotid arteries are patent through the skull base and siphon regions. On the left, the anterior and middle cerebral vessels appear normal. On the right, there is flow in the anterior and middle cerebral vessels, though the middle cerebral artery appears somewhat narrow and irregular compared to the left and compared to the study of 2017. This could be recanalized from previous embolic occlusion. Question aneurysm of the left anterior cerebral artery in the proximal pericallosal region. This area suffers from significant motion degradation. CT angiography recommended for clarification. Posterior circulation: Motion degradation in the lower portion of the exam except valuation of the distal vertebral artery suboptimal. Both do show antegrade flow to the basilar artery. No basilar stenosis. Superior cerebellar and posterior cerebral arteries show flow. Right PCA receives most of it supply from the anterior circulation. Some stenoses are visible within the PCA branches, right more than left. Anatomic variants: None other significant.  Other: None. IMPRESSION: 1. Motion degraded study. No evidence of large vessel occlusion. 2. The right middle cerebral artery appears somewhat irregular and narrowed compared to the left and compared to the study of 2017. This could be recanalized from previous embolic occlusion. 3. Some stenoses of the PCA branches, right more than left. Right PCA receives most of it supply from the anterior circulation. 4. Cannot rule out small aneurysm of the left. Close all artery. Unfortunately, there is motion degradation in this region. Consider CT angiography for clarification. Electronically Signed   By: Nelson Chimes M.D.   On: 03/03/2022 08:35    Assessment/Plan:   Principal Problem:   Atrial fibrillation with RVR ([place]) Active Problems:   Cerebrovascular accident (CVA) ([place])   Coffee ground emesis   Fall   Erosive esophagitis   Esophageal ulcer without bleeding   Patient Summary: [name] [name] is a 81 y.o. with a pertinent PMH of prior CVA, mixed Alzheimer's and vascular dementia, Afib not on anticoagulation who presented after fall and admitted for Afib with RVR and acute right MCA stroke.  Fall Acute right MCA CVA Hx of prior CVA Patient able to follow some commands for his right side. Will answer some questions. Had palliative care meeting yesterday to discuss [place]. Family still believes patient would want further medical management given significant event. PT and OT recommend acute inpatient rehab. Pending speech evaluation. A1c 5.3%. LDL 119.  -appreciate neurology assistance with his care -restart ASA 325 mg po or 300 mg per rectum -will repeat head CT in 7 days and if no acte process then start anticoagulation and stop ASA -echo not indicated given known Afib -MRA and carotid duplex -EEG  -appreciate PT/OT/SLP assistance in care -telemetry monitoring -neuro checks  Hematemesis Patient had episode of coffee ground emesis yesterday. No further episodes. Son denies past history of  ulcers or hematemesis. Hgb 10.6 from 11.4. GI performed EGD showed grade  C reflux esophagitis and esophageal ulcers with no active bleeding, awaiting biopsy results. Will continue with IV Protonix.  -appreciate GI assistance in care   -IV Protonix 40 mg q12h  Atrial fibrillation, not on anticoagulation Patient with history of Afib not on anticoagulation due to patient refusal. Initially presented with RVR and was rate controlled with diltiazem.   AKI Rhabdomyolysis  CK 3,449 on admission, improved to 3218. Admission creatinine 1.62, improved to 1.33. Will continue IV fluids.  -NS 125 ml/hr  Conjunctivitis  Yellow crusting noted over both eyes more left over right.  -polytrim opthalmic drops  Abdominal aortic aneurysm CT A/P showed 5 cm infrarenal AAA which measured 4.1 cm on 01/10/2015. Will need follow-up every 6 months with vascular consultation in outpatient setting.   Hx of mixed Alzheimer and vascular dementia Son reports baseline confusion but redirectable. Will carry conversations but at times mumbles. Patient lives alone but family has installed cameras. Son is working on possible nursing facilities but patient has been resistant.   Goals of Care Son is HPOA. Patient had expressed previously about being DNR and will honor his wishes. Had meeting with family and palliative care about GOC. Son still believes patient would want medical management for his CVA given this is unexpected and significant event. Will continue discussion about goals of care. Appreciate palliative care team's assistance.     Diet: NPO IVF: NS,125cc/hr VTE: None Code: DNR PT/OT recs: CIR TOC recs: pending Family Update: updated son and daughter-in-law at bedside today   Dispo: pending medical stability and [place] discussion   Angelique Blonder, DO Internal Medicine Resident PGY-1 Pager: [phone] Please contact the on call pager after 5 pm and on weekends at [phone].

## 2022-03-03 NOTE — Progress Notes (Signed)
Called by RN to evaluate patient at the bedside for afib with RVR to the 140s. HDS with MAP between 90-100. No lightheadedness/dizziness,CP,SOB. Patient with AMS, decreased alertness, oriented to person only. On exam he was neglecting his left side and had 0-1/5 L upper and lower extremity strength with L facial droop consistent with prior neuro exam. Do not have concern for new stroke at this time. Will start dilt drip for RVR.

## 2022-03-04 DIAGNOSIS — I63411 Cerebral infarction due to embolism of right middle cerebral artery: Secondary | ICD-10-CM

## 2022-03-04 DIAGNOSIS — Z66 Do not resuscitate: Secondary | ICD-10-CM

## 2022-03-04 DIAGNOSIS — T796XXA Traumatic ischemia of muscle, initial encounter: Secondary | ICD-10-CM | POA: Diagnosis not present

## 2022-03-04 DIAGNOSIS — K2210 Ulcer of esophagus without bleeding: Secondary | ICD-10-CM

## 2022-03-04 DIAGNOSIS — M6282 Rhabdomyolysis: Secondary | ICD-10-CM | POA: Diagnosis present

## 2022-03-04 DIAGNOSIS — I4891 Unspecified atrial fibrillation: Secondary | ICD-10-CM | POA: Diagnosis not present

## 2022-03-04 LAB — CBC WITH DIFFERENTIAL/PLATELET
Abs Immature Granulocytes: 0.04 10*3/uL (ref 0.00–0.07)
Basophils Absolute: 0 10*3/uL (ref 0.0–0.1)
Basophils Relative: 0 %
Eosinophils Absolute: 0.2 10*3/uL (ref 0.0–0.5)
Eosinophils Relative: 2 %
HCT: 32.8 % — ABNORMAL LOW (ref 39.0–52.0)
Hemoglobin: 11.1 g/dL — ABNORMAL LOW (ref 13.0–17.0)
Immature Granulocytes: 0 %
Lymphocytes Relative: 14 %
Lymphs Abs: 1.3 10*3/uL (ref 0.7–4.0)
MCH: 32.2 pg (ref 26.0–34.0)
MCHC: 33.8 g/dL (ref 30.0–36.0)
MCV: 95.1 fL (ref 80.0–100.0)
Monocytes Absolute: 0.8 10*3/uL (ref 0.1–1.0)
Monocytes Relative: 8 %
Neutro Abs: 6.9 10*3/uL (ref 1.7–7.7)
Neutrophils Relative %: 76 %
Platelets: 215 10*3/uL (ref 150–400)
RBC: 3.45 MIL/uL — ABNORMAL LOW (ref 4.22–5.81)
RDW: 13.4 % (ref 11.5–15.5)
WBC: 9.3 10*3/uL (ref 4.0–10.5)
nRBC: 0 % (ref 0.0–0.2)

## 2022-03-04 LAB — BASIC METABOLIC PANEL
Anion gap: 7 (ref 5–15)
BUN: 27 mg/dL — ABNORMAL HIGH (ref 8–23)
CO2: 19 mmol/L — ABNORMAL LOW (ref 22–32)
Calcium: 8.4 mg/dL — ABNORMAL LOW (ref 8.9–10.3)
Chloride: 113 mmol/L — ABNORMAL HIGH (ref 98–111)
Creatinine, Ser: 1.39 mg/dL — ABNORMAL HIGH (ref 0.61–1.24)
GFR, Estimated: 51 mL/min — ABNORMAL LOW (ref >60)
Glucose, Bld: 99 mg/dL (ref 70–99)
Potassium: 3.8 mmol/L (ref 3.5–5.1)
Sodium: 139 mmol/L (ref 135–145)

## 2022-03-04 LAB — LIPID PANEL
Cholesterol: 144 mg/dL (ref 0–200)
HDL: 25 mg/dL — ABNORMAL LOW (ref >40)
LDL Cholesterol: 92 mg/dL (ref 0–99)
Total CHOL/HDL Ratio: 5.8 RATIO
Triglycerides: 136 mg/dL (ref <150)
VLDL: 27 mg/dL (ref 0–40)

## 2022-03-04 LAB — CK: Total CK: 1662 U/L — ABNORMAL HIGH (ref 49–397)

## 2022-03-04 MED ORDER — DILTIAZEM HCL 60 MG PO TABS
60.0000 mg | ORAL_TABLET | Freq: Four times a day (QID) | ORAL | Status: DC
  Administered 2022-03-04 – 2022-03-06 (×7): 60 mg via ORAL
  Filled 2022-03-04 (×7): qty 1

## 2022-03-04 MED ORDER — ATORVASTATIN CALCIUM 40 MG PO TABS
40.0000 mg | ORAL_TABLET | Freq: Every day | ORAL | Status: DC
  Administered 2022-03-04 – 2022-03-06 (×3): 40 mg via ORAL
  Filled 2022-03-04 (×3): qty 1

## 2022-03-04 MED ORDER — APIXABAN 5 MG PO TABS
5.0000 mg | ORAL_TABLET | Freq: Two times a day (BID) | ORAL | Status: DC
  Administered 2022-03-04 – 2022-03-06 (×5): 5 mg via ORAL
  Filled 2022-03-04 (×5): qty 1

## 2022-03-04 NOTE — Progress Notes (Signed)
                                                     Palliative Care Progress Note, Assessment & Plan   Patient Name: [name] [name]       Date: 03/04/2022 DOB: Jan 05, 1941  Age: 81 y.o. MRN#: [id number] Attending Physician: [name] Groves, DO Primary Care Physician: [name] Harrier, MD Admit Date: 03/01/2022  Reason for Consultation/Follow-up: Establishing goals of care  Subjective: Patient is lying in bed on his right side.  He acknowledges my presence but is not able to move his neck to meet my gaze.  He endorses no acute complaints and denies pain at this time.  No family present at bedside.  HPI: Patient is a 81 y.o. male with a PMH of prior CNA, mixed Alzheimer's and vascular dementia, AFib (not on [facility]) who presented s/p fall. Pt being treated for AFib with RVR with diltiazem gtt. He is being considered for CIR.  Summary of counseling/coordination of care: After reviewing the patient's chart and assessing the patient at bedside, I attempted to speak with patient in regards to goals of care.  He was able to answer orientation questions appropriately but could not provide further detail.  Patient is not appropriate to engage in medical decision making independently.  After my visit, I spoke with patient's son [name] [name] over the phone.  Brief medical update given.  I attempted to elicit goals and values important to him and his father.  [name] [name] shares that family is somewhat accustomed to dealing with chronic illnesses.  He is hopeful that his father will be able to be accepted at [name].  He shares he had a discussion with patient earlier today when patient understood what CR was and was willing and accepting of this level of therapy.  DNR remains.  [name] [name] remains hopeful that patient can have some what ever functional recovery near his previous baseline.  PMT will  continue to follow the patient at his hospitalization and support patient and family with goals of care discussions.  Physical Exam Vitals reviewed.  Constitutional:      General: He is not in acute distress.    Appearance: Normal appearance. He is not ill-appearing.  HENT:     Mouth/Throat:     Mouth: Mucous membranes are moist.  Cardiovascular:     Rate and Rhythm: Normal rate.     Pulses: Normal pulses.  Pulmonary:     Effort: Pulmonary effort is normal.  Abdominal:     Palpations: Abdomen is soft.  Musculoskeletal:     Comments: Generalized weakness  Skin:    General: Skin is warm and dry.  Neurological:     Mental Status: He is alert.             Palliative Assessment/Data: 40%    Total Time 50 minutes  Greater than 50%  of this time was spent counseling and coordinating care related to the above assessment and plan.  Thank you for allowing the Palliative Medicine Team to assist in the care of this patient.  [name] Ilsa Iha, FNP-BC Palliative Medicine Team Team Phone # [phone]

## 2022-03-04 NOTE — Progress Notes (Signed)
Pt is fixated to word "Debbie" and is unable to follow any of my command.

## 2022-03-04 NOTE — Progress Notes (Signed)
Physical Therapy Treatment Patient Details Name: [name] [name] MRN: [id number] DOB: 1941/02/29 Today's Date: 03/04/2022   History of Present Illness Pt is an 81 y/o M presenting to ED on 12/2 after ground level fall, LUE negative for fractures, MRI revealing acute/subacute R MCA territory infarct involving R caudate head internal capsule, and lentiform nucleus and diffusely over R temporal, parietal, and posterior frontal lobes. MRI C spine revealing prevertebral fluid from skull base through C6-7 without adjacent bone edema. PMH includes A fib, HLD, GERD, and dementia.    PT Comments    Pt was seen for further rehab today with help to sit on side of bed with two person support, specifically to control posterior and L lean.  Pt is at times pulling away from PT repositioning RUE to support elbow and reduce lateral resistance, and is difficult to keep in midline orientation.  Follow to get to more supported posture and will see when pt can be assisted to stand up.  Current sitting with 2 mod assist and pillows to avoid full push over to L side, and at times working to flex R elbow for midline orientation.  Will need maximove to get to a chair and even then may need to be supervised at all times to sit OOB.   Recommendations for follow up therapy are one component of a multi-disciplinary discharge planning process, led by the attending physician.  Recommendations may be updated based on patient status, additional functional criteria and insurance authorization.  Follow Up Recommendations  Acute inpatient rehab (3hours/day)     Assistance Recommended at Discharge Frequent or constant Supervision/Assistance  Patient can return home with the following Two people to help with walking and/or transfers;A lot of help with bathing/dressing/bathroom;Assistance with cooking/housework;Direct supervision/assist for medications management;Direct supervision/assist for financial management;Assist for  transportation;Help with stairs or ramp for entrance   Equipment Recommendations  None recommended by PT    Recommendations for Other Services Rehab consult     Precautions / Restrictions Precautions Precautions: Fall;Cervical Precaution Booklet Issued: No Precaution Comments: L hemiplegia Required Braces or Orthoses: Cervical Brace Cervical Brace: Soft collar;For comfort;At all times Restrictions Weight Bearing Restrictions: No     Mobility  Bed Mobility Overal bed mobility: Needs Assistance Bed Mobility: Supine to Sit, Sit to Supine, Rolling Rolling: Max assist Sidelying to sit: Max assist, +2 for safety/equipment, +2 for physical assistance     Sit to sidelying: Max assist, +2 for physical assistance, +2 for safety/equipment General bed mobility comments: worked to keep c spine neutral and manage comfort of LUE skin    Transfers Overall transfer level: Needs assistance                 General transfer comment: pt is too resistant with pusher syndrome to work to [employer]    Ambulation/Gait               General Gait Details: unable   Marine scientist Rankin (Stroke Patients Only) Modified Rankin (Stroke Patients Only) Pre-Morbid Rankin Score: No symptoms Modified Rankin: Severe disability     Balance Overall balance assessment: Needs assistance, History of Falls Sitting-balance support: Feet supported, Single extremity supported Sitting balance-Leahy Scale: Poor Sitting balance - Comments: tends to push on RUE to lean L and posteriorly  Cognition Arousal/Alertness: Awake/alert, Lethargic Behavior During Therapy: Flat affect, Impulsive, Restless Overall Cognitive Status: History of cognitive impairments - at baseline                                          Exercises General Exercises - Lower Extremity Long Arc Quad: AAROM, PROM, 10  reps    General Comments General comments (skin integrity, edema, etc.): worked with second person to get pt to sit in midline with RUE flexed adn used wb at times to reorient midline.  Pt is a bit aggressive with the response to this redirection, pulling away at times with RUE      Pertinent Vitals/Pain Pain Assessment Pain Assessment: Faces Faces Pain Scale: No hurt    Home Living                          Prior Function            PT Goals (current goals can now be found in the care plan section) Acute Rehab PT Goals Patient Stated Goal: none stated    Frequency    Min 4X/week      PT Plan Current plan remains appropriate    Co-evaluation              AM-PAC PT "6 Clicks" Mobility   Outcome Measure  Help needed turning from your back to your side while in a flat bed without using bedrails?: A Lot Help needed moving from lying on your back to sitting on the side of a flat bed without using bedrails?: Total Help needed moving to and from a bed to a chair (including a wheelchair)?: Total Help needed standing up from a chair using your arms (e.g., wheelchair or bedside chair)?: Total Help needed to walk in hospital room?: Total Help needed climbing 3-5 steps with a railing? : Total 6 Click Score: 7    End of Session Equipment Utilized During Treatment: Cervical collar Activity Tolerance: Patient limited by fatigue;Treatment limited secondary to agitation;Patient limited by lethargy Patient left: in bed;with call bell/phone within reach;with bed alarm set;with family/visitor present Nurse Communication: Mobility status PT Visit Diagnosis: Muscle weakness (generalized) (M62.81);Difficulty in walking, not elsewhere classified (R26.2);Pain;Hemiplegia and hemiparesis;Other symptoms and signs involving the nervous system (R29.898) Hemiplegia - Right/Left: Left Hemiplegia - dominant/non-dominant: Non-dominant Hemiplegia - caused by: Cerebral infarction      Time: 6387-5643 PT Time Calculation (min) (ACUTE ONLY): 29 min  Charges:  $Therapeutic Exercise: 8-22 mins $Therapeutic Activity: 8-22 mins  Ramond Dial 03/04/2022, 3:43 PM  Mee Hives, PT PhD Acute Rehab Dept. Number: [place] and [place]

## 2022-03-04 NOTE — Progress Notes (Addendum)
STROKE TEAM PROGRESS NOTE   INTERVAL HISTORY On evaluation today patient is alert but oriented only to self. He is only intermittently following commands on the right side with notable left-sided neglect and fixed, right gaze preference. L eyelid is open. Vital signs are stable.  Vitals:   03/04/22 0020 03/04/22 0250 03/04/22 0420 03/04/22 0804  BP: 128/85 135/85 126/78 139/85  Pulse: 85 89 83 95  Resp: '20 20 20 20  '$ Temp: 99.2 F (37.3 C)  98.8 F (37.1 C) 98.9 F (37.2 C)  TempSrc: Oral  Oral Oral  SpO2: 96% 97% 97%   Weight:      Height:       CBC:  Recent Labs  Lab 03/03/22 0258 03/04/22 0327  WBC 12.0* 9.3  NEUTROABS 9.5* 6.9  HGB 10.6* 11.1*  HCT 32.2* 32.8*  MCV 97.3 95.1  PLT 210 371   Basic Metabolic Panel:  Recent Labs  Lab 03/02/22 0119 03/03/22 0258 03/04/22 0327  NA 138 139 139  K 4.4 3.9 3.8  CL 109 113* 113*  CO2 20* 21* 19*  GLUCOSE 118* 104* 99  BUN 28* 27* 27*  CREATININE 1.50* 1.33* 1.39*  CALCIUM 9.3 8.5* 8.4*  MG 2.1  --   --   PHOS 2.6  --   --    Lipid Panel:  Recent Labs  Lab 03/04/22 0327  CHOL 144  TRIG 136  HDL 25*  CHOLHDL 5.8  VLDL 27  LDLCALC 92   HgbA1c:  Recent Labs  Lab 03/01/22 1839  HGBA1C 5.3   Urine Drug Screen:  Recent Labs  Lab 03/01/22 2131  LABOPIA NONE DETECTED  COCAINSCRNUR NONE DETECTED  LABBENZ NONE DETECTED  AMPHETMU NONE DETECTED  THCU NONE DETECTED  LABBARB NONE DETECTED    Alcohol Level  Recent Labs  Lab 03/01/22 1135  ETH <10    IMAGING past 24 hours EEG adult  Result Date: 03/03/2022 Spero Curb, MD     03/03/2022  9:37 PM [facility] Routine EEG Report Video Performed: Not performed Demographics: Patient Name:   [name] [name] Date of Birth:   [birth year] Identification Number:   MRN - [id number] Study Times: Study Start Time:   03/03/2022 14:12:00 Study End Time:   03/03/2022 15:03:00 Indication(s): Spells, Eval for Seizures,  Encephalopathy Technical Summary: This EEG was performed utilizing standard International 10-20 System of electrode placement. One channel electrocardiogram was monitored. Data were obtained, stored, and interpreted utilizing referential montage recording, with reformatting to longitudinal, transverse bipolar, and referential montages as necessary for interpretation. State(s):      Drowsy      Asleep Activation Procedures: Hyperventilation: Not performed Photic Stimulation: Not performed EEG Description: The patient appeared somnolent, the background showed a reduction of posterior dominant alpha rhythm. There were continuous of low amplitude diffuse 6-7 Hz theta activity noted.  The amplitudes were low at 20 uV.  There were reductions of physiologic sleep structures observed.  There was no clinical event noted.  Throughout the record, there was no epileptiform abnormality or lateralizing sign observed.  Intermittent myogenic and movement artifacts were noted. Impression: This is an abnormal EEG indicative of a mild to moderate diffuse encephalopathy. This is non-specific in etiology.  No epileptiform abnormality, electrographic seizures or evidence of status epilepticus were present. No lateralizing sign is observed. Dr [name] For Inpatient follow-up with [facility] physician please call [facility] [phone]. This is not an outpatient service. Post hospital discharge, please contact hospital directly.  VAS US CAROTID  Result Date: 03/03/2022 Carotid Arterial Duplex Study Patient Name:  [name] [name] [facility]  Date of Exam:   03/03/2022 Medical Rec #: [id number]          Accession #:    [id number] Date of Birth: 08-May-1940          Patient Gender: M Patient Age:   41 years Exam Location:  [facility] Procedure:      VAS US CAROTID Referring Phys: Lesleigh Noe --------------------------------------------------------------------------------  Indications:       CVA. Risk Factors:       None. Comparison Study:  No prior studies. Performing Technologist: Oliver Hum RVT  Examination Guidelines: A complete evaluation includes B-mode imaging, spectral Doppler, color Doppler, and power Doppler as needed of all accessible portions of each vessel. Bilateral testing is considered an integral part of a complete examination. Limited examinations for reoccurring indications may be performed as noted.  Right Carotid Findings: +----------+--------+--------+--------+-----------------------+--------+           PSV cm/sEDV cm/sStenosisPlaque Description     Comments +----------+--------+--------+--------+-----------------------+--------+ CCA Prox  67      16              smooth and heterogenoustortuous +----------+--------+--------+--------+-----------------------+--------+ CCA Distal52      21              smooth and heterogenous         +----------+--------+--------+--------+-----------------------+--------+ ICA Prox  29      12              smooth and heterogenous         +----------+--------+--------+--------+-----------------------+--------+ ICA Distal38      20                                     tortuous +----------+--------+--------+--------+-----------------------+--------+ ECA       62      11                                              +----------+--------+--------+--------+-----------------------+--------+ +----------+--------+-------+--------+-------------------+           PSV cm/sEDV cmsDescribeArm Pressure (mmHG) +----------+--------+-------+--------+-------------------+ [id number]                                         +----------+--------+-------+--------+-------------------+ +---------+--------+--+--------+--+---------+ VertebralPSV cm/s36EDV cm/s14Antegrade +---------+--------+--+--------+--+---------+  Left Carotid Findings: +----------+--------+--------+--------+-----------------------+--------+           PSV cm/sEDV  cm/sStenosisPlaque Description     Comments +----------+--------+--------+--------+-----------------------+--------+ CCA Prox  50      13              smooth and heterogenous         +----------+--------+--------+--------+-----------------------+--------+ CCA Distal68      24              smooth and heterogenous         +----------+--------+--------+--------+-----------------------+--------+ ICA Prox  61      20              smooth and heterogenoustortuous +----------+--------+--------+--------+-----------------------+--------+ ICA Distal31      15  tortuous +----------+--------+--------+--------+-----------------------+--------+ ECA       63      12                                              +----------+--------+--------+--------+-----------------------+--------+ +----------+--------+--------+--------+-------------------+           PSV cm/sEDV cm/sDescribeArm Pressure (mmHG) +----------+--------+--------+--------+-------------------+ [id number]                                          +----------+--------+--------+--------+-------------------+ +---------+--------+--+--------+-+---------+ VertebralPSV cm/s27EDV cm/s8Antegrade +---------+--------+--+--------+-+---------+   Summary: Right Carotid: Velocities in the right ICA are consistent with a 1-39% stenosis. Left Carotid: Velocities in the left ICA are consistent with a 1-39% stenosis. Vertebrals: Bilateral vertebral arteries demonstrate antegrade flow. *See table(s) above for measurements and observations.     Preliminary     PHYSICAL EXAM General: in no acute distress HEENT: normocephalic Cardiovascular: regular rate Respiratory: normal respiratory effort and on RA Gastrointestinal: non-tender and non-distended Extremities:  moving only R side  Mental Status: [name] [name] is alert; he is oriented to person. Speech was slurred. He was able to follow  only simple commands intermittently. Notable left-sided neglect Cranial Nerves: II:  pupils equal, round, reactive to light and accommodation III,IV, VI: notable right gaze preference but able to look to the left to midline V,VII: smile asymmetric with notable droop on L side  Motor: Right : Upper extremity   5/5    Left:     Upper extremity   0/5 no muscle contraction/5  Lower extremity   5/5     Lower extremity   0/5 no muscle contraction/5  ASSESSMENT/PLAN Mr. [name] [name] is a 81 y.o. male with past medical history of prior CVA, afib reportedly on anticoagulation but compliance unclear, HLD, GERD, and alzheimers and vascular dementia presenting as a level 1 trauma (fall on thinners) on 03/01/2022 with right MCA infarct on imaging  #R MCA stroke In the setting of atrial fibrillation with questionable compliance on anticoagulation  Code stroke CT head shows subcutaneous hematoma in the frontal and left parietal scalp but no acute intracranial abnormality. 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 Small vessel disease present. Atrophy present.  MRI shows acute/subacute right MCA territory infarct involving the right caudate head internal capsule and lentiform nucleus as well as more diffusely over the right temporal and parietal lobe as well as the posterior right frontal lobe, areas of susceptibility within the posterior right lentiform nucleus and caudate head consistent with petechial hemorrhage, and remote lacunar infarcts of L cerebellum MRA is motion degraded study. No evidence of large vessel occlusion, right middle cerebral artery appears somewhat irregular and narrowed compared to the left and compared to the study of 2017. Some stenoses of the PCA branches, right more than left. Right PCA receives most of it supply from the anterior circulation. Carotid Doppler shows velocities in right ICA and left ICA consistent with 1 - 39% stenosis 2D Echo not indicated at this time given  known a-fib LDL 92 fasting HgbA1c 5.3 VTE prophylaxis - GI cleared to restart apixaban    Diet   DIET SOFT Room service appropriate? Yes; Fluid consistency: Thin   Prescribed apixaban but unknown if taking  prior to admission. No indication for DAPT when  on apixaban May restart apixaban without repeating CT head Therapy recommendations: acute inpatient rehab Disposition:  pending medical improvement  #Hyperlipidemia Home meds:  none LDL 92 fasting, goal < 70 May start high intensity statin to meet LDL goal Continue statin on discharge  Other Active Problems to be managed by primary team #Atrial fibrillation, rate controlled  #AKI #Rhabdomyolysis #Hematemesis #Abdominal aortic aneurysm  #Hx of mixed Alzheimer and vascular dementia #Goals of Care   Hospital day # 2 I have personally obtained history,examined this patient, reviewed notes, independently viewed imaging studies, participated in medical decision making and plan of care.ROS completed by me personally and pertinent positives fully documented  I have made any additions or clarifications directly to the above note. Agree with note above.  Patient neurological condition remains unchanged.  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  Recommend start Eliquis when patient is able to swallow.  Continue ongoing aggressive risk factor modification and physical Occupational Therapy.  Stroke team will sign off.  Kindly call for questions.  Antony Contras, MD Medical Director [place] Pager: [phone] 03/04/2022 5:09 PM  To contact Stroke Continuity provider, please refer to [email or web address] After hours, contact General Neurology

## 2022-03-04 NOTE — Progress Notes (Signed)
HD#2 Subjective:   Summary: [name] [name] is a 81 y.o. male with PMH of prior CVA, mixed Alzheimer's and vascular dementia, Afib not on anticoagulation who presents after fall and admitted for Afib with RVR and acute right MCA stroke.  Overnight Events: Afib with RVR, restarted diltiazem drip  Patient is more awake today. Still has left-sided neglect but able to follow commands on right. Oriented to only self and place which is about baseline per son.   Objective:  Vital signs in last 24 hours: Vitals:   03/03/22 2300 03/04/22 0020 03/04/22 0250 03/04/22 0420  BP: (!) 131/102 128/85 135/85 126/78  Pulse: (!) 112 85 89 83  Resp: '20 20 20 20  '$ Temp: 99.2 F (37.3 C) 99.2 F (37.3 C)  98.8 F (37.1 C)  TempSrc:  Oral  Oral  SpO2:  96% 97% 97%  Weight:      Height:       Supplemental O2: Room Air SpO2: 97 % O2 Flow Rate (L/min): 2 L/min   Physical Exam:  Constitutional: alert, laying in bed, in no acute distress HEENT: normocephalic  Cardiovascular: irregular rhythm, normal rate Pulmonary/Chest: normal work of breathing on room air Neurological: alert and oriented x 2 to person and place, asymmetric smile unchanged, able to follow some commands on right side with notable left-sided neglect Skin: bruising over left UE  Filed Weights   03/01/22 1222 03/01/22 1851 03/03/22 0854  Weight: 75 kg 74.1 kg 74.1 kg     Intake/Output Summary (Last 24 hours) at 03/04/2022 0625 Last data filed at 03/03/2022 2302 Gross per 24 hour  Intake 2017.95 ml  Output --  Net 2017.95 ml    Net IO Since Admission: 1,844.65 mL [03/04/22 0625]  Pertinent Labs:    Latest Ref Rng & Units 03/04/2022    3:27 AM 03/03/2022    2:58 AM 03/02/2022   10:51 AM  CBC  WBC 4.0 - 10.5 K/uL 9.3  12.0  18.4   Hemoglobin 13.0 - 17.0 g/dL 11.1  10.6  11.4   Hematocrit 39.0 - 52.0 % 32.8  32.2  34.6   Platelets 150 - 400 K/uL 215  210  229        Latest Ref Rng & Units 03/04/2022    3:27 AM  03/03/2022    2:58 AM 03/02/2022    1:19 AM  CMP  Glucose 70 - 99 mg/dL 99  104  118   BUN 8 - 23 mg/dL '27  27  28   '$ Creatinine 0.61 - 1.24 mg/dL 1.39  1.33  1.50   Sodium 135 - 145 mmol/L 139  139  138   Potassium 3.5 - 5.1 mmol/L 3.8  3.9  4.4   Chloride 98 - 111 mmol/L 113  113  109   CO2 22 - 32 mmol/L '19  21  20   '$ Calcium 8.9 - 10.3 mg/dL 8.4  8.5  9.3     Imaging: EEG adult  Result Date: 03/03/2022 Spero Curb, MD     03/03/2022  9:37 PM [facility] Routine EEG Report Video Performed: Not performed Demographics: Patient Name:   [name] [name], [name] [name] Date of Birth:   [birth year] Identification Number:   MRN - [id number] Study Times: Study Start Time:   03/03/2022 14:12:00 Study End Time:   03/03/2022 15:03:00 Indication(s): Spells, Eval for Seizures, Encephalopathy Technical Summary: This EEG was performed utilizing standard International 10-20 System of electrode placement. One channel electrocardiogram was monitored.  Data were obtained, stored, and interpreted utilizing referential montage recording, with reformatting to longitudinal, transverse bipolar, and referential montages as necessary for interpretation. State(s):      Drowsy      Asleep Activation Procedures: Hyperventilation: Not performed Photic Stimulation: Not performed EEG Description: The patient appeared somnolent, the background showed a reduction of posterior dominant alpha rhythm. There were continuous of low amplitude diffuse 6-7 Hz theta activity noted.  The amplitudes were low at 20 uV.  There were reductions of physiologic sleep structures observed.  There was no clinical event noted.  Throughout the record, there was no epileptiform abnormality or lateralizing sign observed.  Intermittent myogenic and movement artifacts were noted. Impression: This is an abnormal EEG indicative of a mild to moderate diffuse encephalopathy. This is non-specific in etiology.  No epileptiform  abnormality, electrographic seizures or evidence of status epilepticus were present. No lateralizing sign is observed. Dr [name] For Inpatient follow-up with [facility] physician please call [facility] [phone]. This is not an outpatient service. Post hospital discharge, please contact hospital directly.   VAS US CAROTID  Result Date: 03/03/2022 Carotid Arterial Duplex Study Patient Name:  [name] [name] [facility]  Date of Exam:   03/03/2022 Medical Rec #: [id number]          Accession #:    [id number] Date of Birth: 01/01/1941          Patient Gender: M Patient Age:   2 years Exam Location:  [facility] Procedure:      VAS US CAROTID Referring Phys: Lesleigh Noe --------------------------------------------------------------------------------  Indications:       CVA. Risk Factors:      None. Comparison Study:  No prior studies. Performing Technologist: Oliver Hum RVT  Examination Guidelines: A complete evaluation includes B-mode imaging, spectral Doppler, color Doppler, and power Doppler as needed of all accessible portions of each vessel. Bilateral testing is considered an integral part of a complete examination. Limited examinations for reoccurring indications may be performed as noted.  Right Carotid Findings: +----------+--------+--------+--------+-----------------------+--------+           PSV cm/sEDV cm/sStenosisPlaque Description     Comments +----------+--------+--------+--------+-----------------------+--------+ CCA Prox  67      16              smooth and heterogenoustortuous +----------+--------+--------+--------+-----------------------+--------+ CCA Distal52      21              smooth and heterogenous         +----------+--------+--------+--------+-----------------------+--------+ ICA Prox  29      12              smooth and heterogenous         +----------+--------+--------+--------+-----------------------+--------+ ICA Distal38       20                                     tortuous +----------+--------+--------+--------+-----------------------+--------+ ECA       62      11                                              +----------+--------+--------+--------+-----------------------+--------+ +----------+--------+-------+--------+-------------------+           PSV cm/sEDV cmsDescribeArm Pressure (mmHG) +----------+--------+-------+--------+-------------------+ [id number]                                         +----------+--------+-------+--------+-------------------+ +---------+--------+--+--------+--+---------+  VertebralPSV cm/s36EDV cm/s14Antegrade +---------+--------+--+--------+--+---------+  Left Carotid Findings: +----------+--------+--------+--------+-----------------------+--------+           PSV cm/sEDV cm/sStenosisPlaque Description     Comments +----------+--------+--------+--------+-----------------------+--------+ CCA Prox  50      13              smooth and heterogenous         +----------+--------+--------+--------+-----------------------+--------+ CCA Distal68      24              smooth and heterogenous         +----------+--------+--------+--------+-----------------------+--------+ ICA Prox  61      20              smooth and heterogenoustortuous +----------+--------+--------+--------+-----------------------+--------+ ICA Distal31      15                                     tortuous +----------+--------+--------+--------+-----------------------+--------+ ECA       63      12                                              +----------+--------+--------+--------+-----------------------+--------+ +----------+--------+--------+--------+-------------------+           PSV cm/sEDV cm/sDescribeArm Pressure (mmHG) +----------+--------+--------+--------+-------------------+ [id number]                                           +----------+--------+--------+--------+-------------------+ +---------+--------+--+--------+-+---------+ VertebralPSV cm/s27EDV cm/s8Antegrade +---------+--------+--+--------+-+---------+   Summary: Right Carotid: Velocities in the right ICA are consistent with a 1-39% stenosis. Left Carotid: Velocities in the left ICA are consistent with a 1-39% stenosis. Vertebrals: Bilateral vertebral arteries demonstrate antegrade flow. *See table(s) above for measurements and observations.     Preliminary    MR ANGIO HEAD WO CONTRAST  Result Date: 03/03/2022 CLINICAL DATA:  Stroke/TIA. Determine embolic source. Right MCA territory stroke. EXAM: MRA HEAD WITHOUT CONTRAST TECHNIQUE: Angiographic images of the Circle of Willis were acquired using MRA technique without intravenous contrast. COMPARISON:  MRI 2 days ago.  MR angiography 12/06/2015. FINDINGS: Anterior circulation: Both internal carotid arteries are patent through the skull base and siphon regions. On the left, the anterior and middle cerebral vessels appear normal. On the right, there is flow in the anterior and middle cerebral vessels, though the middle cerebral artery appears somewhat narrow and irregular compared to the left and compared to the study of 2017. This could be recanalized from previous embolic occlusion. Question aneurysm of the left anterior cerebral artery in the proximal pericallosal region. This area suffers from significant motion degradation. CT angiography recommended for clarification. Posterior circulation: Motion degradation in the lower portion of the exam except valuation of the distal vertebral artery suboptimal. Both do show antegrade flow to the basilar artery. No basilar stenosis. Superior cerebellar and posterior cerebral arteries show flow. Right PCA receives most of it supply from the anterior circulation. Some stenoses are visible within the PCA branches, right more than left. Anatomic variants: None other significant.  Other: None. IMPRESSION: 1. Motion degraded study. No evidence of large vessel occlusion. 2. The right middle cerebral artery appears somewhat irregular and narrowed compared to the left and compared to the study of 2017. This could be recanalized from  previous embolic occlusion. 3. Some stenoses of the PCA branches, right more than left. Right PCA receives most of it supply from the anterior circulation. 4. Cannot rule out small aneurysm of the left. Close all artery. Unfortunately, there is motion degradation in this region. Consider CT angiography for clarification. Electronically Signed   By: Nelson Chimes M.D.   On: 03/03/2022 08:35    Assessment/Plan:   Principal Problem:   Atrial fibrillation with RVR ([place]) Active Problems:   Cerebrovascular accident (CVA) ([place])   Coffee ground emesis   Fall   Erosive esophagitis   Esophageal ulcer without bleeding   Patient Summary: [name] [name] is a 81 y.o. with a pertinent PMH of prior CVA, mixed Alzheimer's and vascular dementia, Afib not on anticoagulation who presented after fall and admitted for Afib with RVR and acute right MCA stroke.  Fall Acute right MCA CVA Hx of prior CVA Continues to be able to follow commands on right side but unable on left. Will answer some questions. PT and OT recommend acute inpatient rehab. Speech evaluated and started soft diet. Fasting LDL 92. EEG did not show any seizure activity. Head MRA showed no large vessel occlusion, irregular and narrowed right MCA, and stenoses of PCA branches. GI and neuro cleared patient to start anticoagulation. Patient is 81yo but weighs 74kg and creatinine 1.39, will start normal dose.  -appreciate neurology assistance with his care -neuro and GI cleared patient to start anticoagulation -stopped aspirin  -start Eliquis 5 mg BID -start Lipitor 40 mg daily -appreciate PT/OT/SLP assistance in care -telemetry monitoring -neuro checks  Hematemesis Patient had episode of coffee  ground emesis 12/3. No further episodes.  Hgb stable at 11.1 from 10.6. EGD showed grade C reflux esophagitis and esophageal ulcers with no active bleeding, awaiting biopsy results.  -appreciate GI assistance in care   -IV Protonix 40 mg q12h  Atrial fibrillation, not on anticoagulation Patient with history of Afib not on anticoagulation due to patient refusal. Initially presented with RVR and was rate controlled with diltiazem. Overnight, night team restarted diltiazem drip. Since speech cleared him, will transition to oral.  -diltiazem 60 mg QID  AKI Rhabdomyolysis  CK 3,449 on admission, improved to 1662. Admission creatinine 1.62, improved to 1.39 after IV fluids.  -repeat BMP  Conjunctivitis  Yellow crusting noted over both eyes more left over right. Much improved today.  -polytrim opthalmic drops  Abdominal aortic aneurysm CT A/P showed 5 cm infrarenal AAA which measured 4.1 cm on 01/10/2015. Will need follow-up every 6 months with vascular consultation in outpatient setting.   Hx of mixed Alzheimer and vascular dementia Son reports baseline confusion but redirectable. Will carry conversations but at times mumbles. Patient lives alone but family has installed cameras. Son is working on possible nursing facilities but patient has been resistant.   Goals of Care Son is HPOA. Patient had expressed previously about being DNR and will honor his wishes. Had meeting with family and palliative care about GOC. Son still believes patient would want medical management for his CVA given this is unexpected and significant event. Will continue discussion about goals of care. Appreciate palliative care team's assistance.     Diet:  Soft  IVF: None,None VTE: DOAC Code: DNR PT/OT recs:  acute inpatient rehab TOC recs: pending placement Family Update: updated son who was at bedside today   Dispo: pending medical stability and placement    Angelique Blonder, DO Internal Medicine Resident  PGY-1 Pager: [phone] Please contact the  on call pager after 5 pm and on weekends at [phone].

## 2022-03-04 NOTE — Progress Notes (Signed)
Inpatient Rehabilitation Admissions Coordinator   Patient currently not at a level to pursue CIR admit. I will follow his progress but other rehab venues should be pursued at this time.  Danne Baxter, RN, MSN Rehab Admissions Coordinator [phone] 03/04/2022 3:11 PM

## 2022-03-05 DIAGNOSIS — I4891 Unspecified atrial fibrillation: Secondary | ICD-10-CM | POA: Diagnosis not present

## 2022-03-05 DIAGNOSIS — I63411 Cerebral infarction due to embolism of right middle cerebral artery: Secondary | ICD-10-CM | POA: Diagnosis not present

## 2022-03-05 DIAGNOSIS — T796XXA Traumatic ischemia of muscle, initial encounter: Secondary | ICD-10-CM | POA: Diagnosis not present

## 2022-03-05 LAB — BASIC METABOLIC PANEL
Anion gap: 12 (ref 5–15)
BUN: 23 mg/dL (ref 8–23)
CO2: 20 mmol/L — ABNORMAL LOW (ref 22–32)
Calcium: 9 mg/dL (ref 8.9–10.3)
Chloride: 107 mmol/L (ref 98–111)
Creatinine, Ser: 1.36 mg/dL — ABNORMAL HIGH (ref 0.61–1.24)
GFR, Estimated: 52 mL/min — ABNORMAL LOW (ref >60)
Glucose, Bld: 99 mg/dL (ref 70–99)
Potassium: 3.7 mmol/L (ref 3.5–5.1)
Sodium: 139 mmol/L (ref 135–145)

## 2022-03-05 LAB — CBC WITH DIFFERENTIAL/PLATELET
Abs Immature Granulocytes: 0.06 10*3/uL (ref 0.00–0.07)
Basophils Absolute: 0 10*3/uL (ref 0.0–0.1)
Basophils Relative: 0 %
Eosinophils Absolute: 0.3 10*3/uL (ref 0.0–0.5)
Eosinophils Relative: 3 %
HCT: 32.8 % — ABNORMAL LOW (ref 39.0–52.0)
Hemoglobin: 11.5 g/dL — ABNORMAL LOW (ref 13.0–17.0)
Immature Granulocytes: 1 %
Lymphocytes Relative: 16 %
Lymphs Abs: 1.4 10*3/uL (ref 0.7–4.0)
MCH: 32.4 pg (ref 26.0–34.0)
MCHC: 35.1 g/dL (ref 30.0–36.0)
MCV: 92.4 fL (ref 80.0–100.0)
Monocytes Absolute: 0.7 10*3/uL (ref 0.1–1.0)
Monocytes Relative: 8 %
Neutro Abs: 6.5 10*3/uL (ref 1.7–7.7)
Neutrophils Relative %: 72 %
Platelets: 231 10*3/uL (ref 150–400)
RBC: 3.55 MIL/uL — ABNORMAL LOW (ref 4.22–5.81)
RDW: 13.3 % (ref 11.5–15.5)
WBC: 9 10*3/uL (ref 4.0–10.5)
nRBC: 0 % (ref 0.0–0.2)

## 2022-03-05 LAB — PHOSPHORUS: Phosphorus: 2.9 mg/dL (ref 2.5–4.6)

## 2022-03-05 LAB — SURGICAL PATHOLOGY

## 2022-03-05 LAB — MAGNESIUM: Magnesium: 1.9 mg/dL (ref 1.7–2.4)

## 2022-03-05 NOTE — Progress Notes (Signed)
Speech Language Pathology Treatment: Dysphagia  Patient Details Name: [name] [name] MRN: [id number] DOB: 05/10/1940 Today's Date: 03/05/2022 Time: 1100-1120 SLP Time Calculation (min) (ACUTE ONLY): 20 min  Assessment / Plan / Recommendation Clinical Impression  Pt seen at bedside for follow up after BSE completed 03/03/22. RN reported difficulty with taking meds with water. Pt was observed with thin liquids, nectar thick liquids, puree, and solid consistencies. Pt did exhibit intermittent difficulty with thin liquids via straw. Nectar thick liquids and puree textures were tolerated well. Pt was noted to exhibit extended oral prep, taking small nibbles of graham cracker, which may hasten fatigue across a whole meal.    Recommend continuing mechanical soft (Dys3) diet, changing liquids to nectar thick liquid. Meds given crushed in puree, as recommended by BSE. Safe swallow precautions posted at [facility], and white board updated. SLP will follow up to assess diet tolerance and determine if instrumental study is needed.    HPI HPI: Pt is an 81 y/o M presenting to ED on 12/2 after ground level fall. LUE negative for fractures, MRI revealing acute/subacute R MCA territory infarct involving R caudate head internal capsule, and lentiform nucleus and diffusely over R temporal, parietal, and posterior frontal lobes. MRI C spine revealing prevertebral fluid from skull base through C6-7 without adjacent bone edema. PMH includes A fib, HLD, GERD, and dementia. Son, [name] [name], says that pt's cognition is impaired at baseline but that he can eat and drink normally.      SLP Plan  Continue with current plan of care      Recommendations for follow up therapy are one component of a multi-disciplinary discharge planning process, led by the attending physician.  Recommendations may be updated based on patient status, additional functional criteria and insurance authorization.    Recommendations  Diet recommendations:  Dysphagia 3 (mechanical soft);Nectar-thick liquid Liquids provided via: Straw Medication Administration: Crushed with puree Supervision: Full supervision/cueing for compensatory strategies;Trained caregiver to feed patient Compensations: Minimize environmental distractions;Slow rate;Small sips/bites Postural Changes and/or Swallow Maneuvers: Seated upright 90 degrees;Upright 30-60 min after meal                Oral Care Recommendations: Oral care BID Follow Up Recommendations:  (TBD) Assistance recommended at discharge: Frequent or constant Supervision/Assistance SLP Visit Diagnosis: Dysphagia, unspecified (R13.10) Plan: Continue with current plan of care          Liane Tribbey B. Quentin Ore, [facility], [place] Speech Language Pathologist Office: [phone]  Shonna Chock 03/05/2022, 11:31 AM

## 2022-03-05 NOTE — TOC Initial Note (Signed)
Transition of Care [facility]) - Initial/Assessment Note    Patient Details  Name: [name] [name] MRN: [id number] Date of Birth: 08/06/1940  [facility] CM/SW Contact:    Milas Gain, [place] Phone Number: 03/05/2022, 11:28 AM  Clinical Narrative:                  CSW received consult for possible SNF placement at time of discharge. Due to patients current orientation CSW spoke with patients son [name] [name] regarding PT recommendation of SNF placement at time of discharge. Patients son reports patient comes from home alone.Patients son expressed understanding of PT recommendation and is agreeable to SNF placement for patient at time of discharge. Patients son gave CSW permission to fax out initial referral near the [place] area.CSW discussed insurance authorization process and will provide Medicare SNF ratings list with accepted SNF bed offers when available.  No further questions reported at this time. CSW to continue to follow and assist with discharge planning needs.  Expected Discharge Plan: Skilled Nursing Facility Barriers to Discharge: Continued Medical Work up   Patient Goals and CMS Choice   CMS Medicare.gov Compare Post Acute Care list provided to:: Patient Represenative (must comment) (patients son [name] [name]) Choice offered to / list presented to : Adult Children (Patients son [name] [name])  Expected Discharge Plan and Services Expected Discharge Plan: [place] In-house Referral: Clinical Social Work     Living arrangements for the past 2 months: [place]                                      Prior Living Arrangements/Services Living arrangements for the past 2 months: Single Family Home Lives with:: Adult Children (Patients son) Patient language and need for interpreter reviewed:: Yes        Need for Family Participation in Patient Care: Yes (Comment) Care giver support system in place?: Yes (comment)   Criminal Activity/Legal  Involvement Pertinent to Current Situation/Hospitalization: No - Comment as needed  Activities of Daily Living      Permission Sought/Granted Permission sought to share information with : Case Manager, Family Supports, Chartered certified accountant granted to share information with : No  Share Information with NAME: Due to patients current orientation CSW spoke with patients son [name] [name]  Permission granted to share info w AGENCY: Due to patients current orientation CSW spoke with patients son [name]/SNF  Permission granted to share info w Relationship: Due to patients current orientation CSW spoke with patients son [name] [name]  Permission granted to share info w Contact Information: Due to patients current orientation CSW spoke with patients son [name] [name] [phone]  Emotional Assessment       Orientation: : Oriented to Self Alcohol / Substance Use: Not Applicable Psych Involvement: No (comment)  Admission diagnosis:  Atrial fibrillation with rapid ventricular response ([place]) [I48.91] Atrial fibrillation with RVR ([place]) [I48.91] Contusion of left forearm, initial encounter [S50.12XA] Fall, initial encounter [W19.XXXA] Traumatic rhabdomyolysis, initial encounter ([place]) [T79.6XXA] Patient Active Problem List   Diagnosis Date Noted   Rhabdomyolysis 03/04/2022   Erosive esophagitis 03/03/2022   Esophageal ulcer without bleeding 03/03/2022   Fall 03/02/2022   Atrial fibrillation with RVR ([place]) 03/01/2022   Cerebrovascular accident (CVA) ([place]) 12/06/2015   PCP:  Tracie Harrier, MD Pharmacy:   [place] [address] Phone: [phone] Fax: [phone]  Social Determinants of Health (SDOH) Interventions    Readmission Risk Interventions     No data to display           

## 2022-03-05 NOTE — Progress Notes (Signed)
Patient HR up in the 140s-150s after pericare and turn.  No complaints offered.  Son and daughter in law at bedside.  Cardizem given as ordered.  Dr. Markus Jarvis made aware.  Will monitor.

## 2022-03-05 NOTE — Progress Notes (Signed)
STROKE TEAM PROGRESS NOTE   INTERVAL HISTORY I received a secure chat from patient's medical resident regarding concerns about him having involuntary movements on the tongue.  On evaluation today patient is alert but oriented only to self. He is only intermittently following commands on the right side with notable left-sided neglect and fixed, right gaze preference. L eyelid is open.  He is not having any visible involuntary tongue movements.  Patient's son is at the bedside informs me that he had some dental work done few days ago and probably has a sore tooth.  He has some dentures and perhaps food may have been stuck beneath it.Vital signs are stable.  Vitals:   03/05/22 0746 03/05/22 0825 03/05/22 1244 03/05/22 1315  BP: (!) 139/104 (!) 123/93 122/80 130/83  Pulse: (!) 110  98 (!) 110  Resp: '19  18 19  '$ Temp:      TempSrc:      SpO2: 97%  96% 96%  Weight:      Height:       CBC:  Recent Labs  Lab 03/04/22 0327 03/05/22 0226  WBC 9.3 9.0  NEUTROABS 6.9 6.5  HGB 11.1* 11.5*  HCT 32.8* 32.8*  MCV 95.1 92.4  PLT 215 681   Basic Metabolic Panel:  Recent Labs  Lab 03/02/22 0119 03/03/22 0258 03/04/22 0327 03/05/22 0226  NA 138   < > 139 139  K 4.4   < > 3.8 3.7  CL 109   < > 113* 107  CO2 20*   < > 19* 20*  GLUCOSE 118*   < > 99 99  BUN 28*   < > 27* 23  CREATININE 1.50*   < > 1.39* 1.36*  CALCIUM 9.3   < > 8.4* 9.0  MG 2.1  --   --  1.9  PHOS 2.6  --   --  2.9   < > = values in this interval not displayed.   Lipid Panel:  Recent Labs  Lab 03/04/22 0327  CHOL 144  TRIG 136  HDL 25*  CHOLHDL 5.8  VLDL 27  LDLCALC 92   HgbA1c:  Recent Labs  Lab 03/01/22 1839  HGBA1C 5.3   Urine Drug Screen:  Recent Labs  Lab 03/01/22 2131  LABOPIA NONE DETECTED  COCAINSCRNUR NONE DETECTED  LABBENZ NONE DETECTED  AMPHETMU NONE DETECTED  THCU NONE DETECTED  LABBARB NONE DETECTED    Alcohol Level  Recent Labs  Lab 03/01/22 1135  ETH <10    IMAGING past 24  hours No results found.  PHYSICAL EXAM General: in no acute distress HEENT: normocephalic Cardiovascular: regular rate Respiratory: normal respiratory effort and on RA Gastrointestinal: non-tender and non-distended Extremities:  moving only R side  Mental Status: [name] [name] is alert; he is oriented to person. Speech was slurred. He was able to follow only simple commands intermittently. Notable left-sided neglect Cranial Nerves: II:  pupils equal, round, reactive to light and accommodation III,IV, VI: notable right gaze preference but able to look to the left to midline V,VII: smile asymmetric with notable droop on L side  Motor: Right : Upper extremity   5/5    Left:     Upper extremity   0/5 no muscle contraction/5  Lower extremity   5/5     Lower extremity   0/5 no muscle contraction/5  ASSESSMENT/PLAN [name] [name] is a 81 y.o. male with past medical history of prior CVA, afib reportedly on anticoagulation but compliance  unclear, HLD, GERD, and alzheimers and vascular dementia presenting as a level 1 trauma (fall on thinners) on 03/01/2022 with right MCA infarct on imaging  #R MCA stroke In the setting of atrial fibrillation with questionable compliance on anticoagulation  Code stroke CT head shows subcutaneous hematoma in the frontal and left parietal scalp but no acute intracranial abnormality. 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 Small vessel disease present. Atrophy present.  MRI shows acute/subacute right MCA territory infarct involving the right caudate head internal capsule and lentiform nucleus as well as more diffusely over the right temporal and parietal lobe as well as the posterior right frontal lobe, areas of susceptibility within the posterior right lentiform nucleus and caudate head consistent with petechial hemorrhage, and remote lacunar infarcts of L cerebellum MRA is motion degraded study. No evidence of large vessel occlusion, right middle  cerebral artery appears somewhat irregular and narrowed compared to the left and compared to the study of 2017. Some stenoses of the PCA branches, right more than left. Right PCA receives most of it supply from the anterior circulation. Carotid Doppler shows velocities in right ICA and left ICA consistent with 1 - 39% stenosis 2D Echo not indicated at this time given known a-fib LDL 92 fasting HgbA1c 5.3 VTE prophylaxis - GI cleared to restart apixaban    Diet   DIET SOFT Room service appropriate? Yes; Fluid consistency: Nectar Thick   Prescribed apixaban but unknown if taking  prior to admission. No indication for DAPT when on apixaban May restart apixaban without repeating CT head Therapy recommendations: acute inpatient rehab Disposition:  pending medical improvement  #Hyperlipidemia Home meds:  none LDL 92 fasting, goal < 70 May start high intensity statin to meet LDL goal Continue statin on discharge  Other Active Problems to be managed by primary team #Atrial fibrillation, rate controlled  #AKI #Rhabdomyolysis #Hematemesis #Abdominal aortic aneurysm  #Hx of mixed Alzheimer and vascular dementia #Goals of Care   Hospital day # 3 I do not see any definite involuntary movements of the tongue.  Patient perhaps was doing this in the morning because of irritation at the site of his recent dental work.  Patient neurological condition remains unchanged.  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  Continue Eliquis  Continue ongoing aggressive risk factor modification and physical Occupational Therapy.  Stroke team will sign off.  Kindly call for questions.  Antony Contras, MD Medical Director [facility] Pager: [phone] 03/05/2022 5:07 PM  To contact Stroke Continuity provider, please refer to [email or web address] After hours, contact General Neurology

## 2022-03-05 NOTE — Progress Notes (Signed)
HD#3 Subjective:   Summary: [name] [name] is a 81 y.o. male with PMH of prior CVA, mixed Alzheimer's and vascular dementia, Afib not on anticoagulation who presents after fall and admitted for Afib with RVR and acute right MCA stroke.  Overnight Events: none  Patient is alert and oriented to self. Able to follow commands on right side but continued neglect on left side. Denies any pain or any new concerns.   Objective:  Vital signs in last 24 hours: Vitals:   03/04/22 1742 03/04/22 2013 03/04/22 2107 03/05/22 0414  BP: 112/71 125/75 116/73 121/67  Pulse: 77 90  86  Resp: '20 20  19  '$ Temp: 98.6 F (37 C) (!) 97.5 F (36.4 C)  97.9 F (36.6 C)  TempSrc: Oral Oral  Axillary  SpO2: 100% 91%  95%  Weight:      Height:       Supplemental O2: Room Air SpO2: 95 % O2 Flow Rate (L/min): 2 L/min   Physical Exam:  Constitutional: alert, laying in bed, in no acute distress HENT: normocephalic  Cardiovascular: irregular rhythm, normal rate Pulmonary/Chest: normal work of breathing on room air Neurological: alert and oriented x 1 to person, asymmetric smile unchanged, able to follow some commands on right side with notable left-sided neglect Skin: warm and dry, bruising over left UE  Filed Weights   03/01/22 1222 03/01/22 1851 03/03/22 0854  Weight: 75 kg 74.1 kg 74.1 kg     Intake/Output Summary (Last 24 hours) at 03/05/2022 0644 Last data filed at 03/04/2022 2104 Gross per 24 hour  Intake 2631.29 ml  Output 1350 ml  Net 1281.29 ml    Net IO Since Admission: 3,125.94 mL [03/05/22 0644]  Pertinent Labs:    Latest Ref Rng & Units 03/05/2022    2:26 AM 03/04/2022    3:27 AM 03/03/2022    2:58 AM  CBC  WBC 4.0 - 10.5 K/uL 9.0  9.3  12.0   Hemoglobin 13.0 - 17.0 g/dL 11.5  11.1  10.6   Hematocrit 39.0 - 52.0 % 32.8  32.8  32.2   Platelets 150 - 400 K/uL 231  215  210        Latest Ref Rng & Units 03/05/2022    2:26 AM 03/04/2022    3:27 AM 03/03/2022    2:58 AM   CMP  Glucose 70 - 99 mg/dL 99  99  104   BUN 8 - 23 mg/dL '23  27  27   '$ Creatinine 0.61 - 1.24 mg/dL 1.36  1.39  1.33   Sodium 135 - 145 mmol/L 139  139  139   Potassium 3.5 - 5.1 mmol/L 3.7  3.8  3.9   Chloride 98 - 111 mmol/L 107  113  113   CO2 22 - 32 mmol/L '20  19  21   '$ Calcium 8.9 - 10.3 mg/dL 9.0  8.4  8.5     Imaging: No results found.  Assessment/Plan:   Principal Problem:   Cerebrovascular accident (CVA) ([place]) Active Problems:   Atrial fibrillation with RVR ([place])   Fall   Erosive esophagitis   Esophageal ulcer without bleeding   Rhabdomyolysis   Patient Summary: [name] [name] is a 81 y.o. with a pertinent PMH of prior CVA, mixed Alzheimer's and vascular dementia, Afib not on anticoagulation who presented after fall and admitted for Afib with RVR and acute right MCA stroke.  Fall Acute right MCA CVA Hx of prior CVA Continues to  be able to follow commands on right side but unable on left. Will answer some questions. Working with PT and OT. Speech has changed diet to have nectar thick fluid to minimize aspiration risk. Patient not at level for CIR, will work on applying for SNF.  -appreciate neurology assistance with his care -Eliquis 5 mg BID -Lipitor 40 mg daily -appreciate PT/OT/SLP assistance in care -telemetry monitoring -neuro checks  Hematemesis Patient had episode of coffee ground emesis 12/3. No further episodes.  Hgb stable at 11.5. EGD showed grade C reflux esophagitis and esophageal ulcers with no active bleeding, awaiting biopsy results.  -appreciate GI assistance in care   -IV Protonix 40 mg q12h  Atrial fibrillation, not on anticoagulation Patient with history of Afib not on anticoagulation due to patient refusal. Initially presented with RVR and was rate controlled with diltiazem. Transitioned him to oral diltiazem, remains rate controlled.  -diltiazem 60 mg QID  AKI Rhabdomyolysis  CK 3,449 on admission, improved to 1662 with IV fluids.  Admission creatinine 1.62 and now improving after IV fluids and po intake.   Conjunctivitis  Very little crusting noted over both eyes. Will continue polytrim opthalmic drops.  Abdominal aortic aneurysm CT A/P showed 5 cm infrarenal AAA which measured 4.1 cm on 01/10/2015. Will need follow-up every 6 months with vascular consultation in outpatient setting.   Hx of mixed Alzheimer and vascular dementia Son reports baseline confusion but redirectable. Will carry conversations but at times mumbles. Patient lives alone but family has installed cameras. Son is working on possible nursing facilities but patient has been resistant.   Goals of Care Son is HPOA. Patient had expressed previously about being DNR and will honor his wishes. Had meeting with family and palliative care about GOC. Son still believes patient would want medical management for his CVA given this is unexpected and significant event. Will continue discussion about goals of care. Appreciate palliative care team's assistance.     Diet:  Soft  (with nectar think fluids) IVF: None,None VTE: DOAC Code: DNR PT/OT recs:  acute inpatient rehab TOC recs: pending SNF placement Family Update: none   Dispo: pending medical stability and SNF placement    Angelique Blonder, DO Internal Medicine Resident PGY-1 Pager: [phone] Please contact the on call pager after 5 pm and on weekends at [phone].

## 2022-03-05 NOTE — Discharge Instructions (Signed)
You were hospitalized for acute stroke and atrial fibrillation. Also found to have esophageal ulcer but no active bleeding. You will continue care at a skilled nursing facility. Thank you for allowing [place] to be part of your care.   Please note these changes made to your medications:  *Please START taking:  -Eliquis 5 mg twice daily -Lipitor 40 mg daily -Diltiazem 90 mg four times daily -Protonix 40 mg daily -Polytrim eye drops for 2 days  *Please avoid NSAIDs.   --------------------------- Information on my medicine - ELIQUIS (apixaban)  Why was Eliquis prescribed for you? Eliquis was prescribed for you to reduce the risk of a blood clot forming that can cause a stroke if you have a medical condition called atrial fibrillation (a type of irregular heartbeat).  What do You need to know about Eliquis ? Take your Eliquis TWICE DAILY - one tablet in the morning and one tablet in the evening with or without food. If you have difficulty swallowing the tablet whole please discuss with your pharmacist how to take the medication safely.  Take Eliquis exactly as prescribed by your doctor and DO NOT stop taking Eliquis without talking to the doctor who prescribed the medication.  Stopping may increase your risk of developing a stroke.  Refill your prescription before you run out.  After discharge, you should have regular check-up appointments with your healthcare provider that is prescribing your Eliquis.  In the future your dose may need to be changed if your kidney function or weight changes by a significant amount or as you get older.  What do you do if you miss a dose? If you miss a dose, take it as soon as you remember on the same day and resume taking twice daily.  Do not take more than one dose of ELIQUIS at the same time to make up a missed dose.  Important Safety Information A possible side effect of Eliquis is bleeding. You should call your healthcare provider right away if you  experience any of the following: Bleeding from an injury or your nose that does not stop. Unusual colored urine (red or dark brown) or unusual colored stools (red or black). Unusual bruising for unknown reasons. A serious fall or if you hit your head (even if there is no bleeding).  Some medicines may interact with Eliquis and might increase your risk of bleeding or clotting while on Eliquis. To help avoid this, consult your healthcare provider or pharmacist prior to using any new prescription or non-prescription medications, including herbals, vitamins, non-steroidal anti-inflammatory drugs (NSAIDs) and supplements.  This website has more information on Eliquis (apixaban): http://www.eliquis.com/eliquis/home

## 2022-03-05 NOTE — Progress Notes (Signed)
  Inpatient Rehabilitation Admissions Coordinator   I spoke with son by phone. I discussed that I am recommending SNF level rehab, not Cir at this time. He is not at a level to be able to tolerate the intensity required for Cir rehab with the expectation to be able to return directly home in about 2 weeks to be managed with 24/7 physical care at home. He is in agreement to SNF. We will sign off at this time.  Danne Baxter, RN, MSN Rehab Admissions Coordinator [phone] 03/05/2022 11:01 AM

## 2022-03-05 NOTE — NC FL2 (Signed)
  [place] LEVEL OF CARE FORM     IDENTIFICATION  Patient Name: [name] [name] Birthdate: 1940-06-29 Sex: male Admission Date (Current Location): 03/01/2022  [facility] and [place] Number:  Herbalist and Address:  [facility] [place], [place] [address], [place], [place] [address]      Provider Number: [id number]  Attending Physician Name and Address:  Lucious Groves, DO  Relative Name and Phone Number:  Dorma Russell) [phone]    Current Level of Care: Hospital Recommended Level of Care: [place] Prior Approval Number:    Date Approved/Denied:   PASRR Number: [id number]  Discharge Plan: SNF    Current Diagnoses: Patient Active Problem List   Diagnosis Date Noted   Rhabdomyolysis 03/04/2022   Erosive esophagitis 03/03/2022   Esophageal ulcer without bleeding 03/03/2022   Fall 03/02/2022   Atrial fibrillation with RVR ([place]) 03/01/2022   Cerebrovascular accident (CVA) ([place]) 12/06/2015    Orientation RESPIRATION BLADDER Height & Weight     Self  Normal Incontinent, External catheter (External Urinary Catheter) Weight: 163 lb 5.8 oz (74.1 kg) Height:  '5\' 11"'$  (180.3 cm)  BEHAVIORAL SYMPTOMS/MOOD NEUROLOGICAL BOWEL NUTRITION STATUS      Continent (WDL) Diet (Please see discharge summary)  AMBULATORY STATUS COMMUNICATION OF NEEDS Skin   Extensive Assist Verbally Other (Comment) (Abrasion,head,arm,L,Ecchymosis,arm,hip,L,wound/Incision LDAs)                       Personal Care Assistance Level of Assistance  Bathing, Feeding, Dressing Bathing Assistance: Maximum assistance Feeding assistance: Maximum assistance Dressing Assistance: Maximum assistance     Functional Limitations Info  Sight, Hearing, Speech Sight Info: Adequate Hearing Info: Adequate Speech Info: Impaired (Difficulty speaking)    SPECIAL CARE FACTORS FREQUENCY  PT (By licensed PT), OT (By licensed OT)     PT Frequency: 5x min weekly OT  Frequency: 5x min weekly            Contractures Contractures Info: Not present    Additional Factors Info  Code Status, Allergies Code Status Info: DNR Allergies Info: No Known Allergies           Current Medications (03/05/2022):  This is the current hospital active medication list Current Facility-Administered Medications  Medication Dose Route Frequency Provider Last Rate Last Admin   acetaminophen (TYLENOL) suppository 650 mg  650 mg Rectal Q6H PRN Rosezella Rumpf, NP   650 mg at 03/02/22 7121   apixaban (ELIQUIS) tablet 5 mg  5 mg Oral BID Angelique Blonder, DO   5 mg at 03/05/22 0825   atorvastatin (LIPITOR) tablet 40 mg  40 mg Oral Daily Joni Reining C, DO   40 mg at 03/05/22 0825   diltiazem (CARDIZEM) tablet 60 mg  60 mg Oral QID Rick Duff, MD   60 mg at 03/05/22 0825   pantoprazole (PROTONIX) injection 40 mg  40 mg Intravenous Q12H Angelique Blonder, DO   40 mg at 03/05/22 9758   trimethoprim-polymyxin b (POLYTRIM) ophthalmic solution 1 drop  1 drop Both Eyes Q6H Angelique Blonder, DO   1 drop at 03/05/22 1103     Discharge Medications: Please see discharge summary for a list of discharge medications.  Relevant Imaging Results:  Relevant Lab Results:   Additional Information [phone]  [place] Gain, LCSWA

## 2022-03-06 ENCOUNTER — Encounter: Payer: Self-pay | Admitting: Gastroenterology

## 2022-03-06 ENCOUNTER — Other Ambulatory Visit (HOSPITAL_COMMUNITY): Payer: Self-pay

## 2022-03-06 DIAGNOSIS — S5012XA Contusion of left forearm, initial encounter: Secondary | ICD-10-CM | POA: Diagnosis not present

## 2022-03-06 DIAGNOSIS — T796XXA Traumatic ischemia of muscle, initial encounter: Secondary | ICD-10-CM | POA: Diagnosis not present

## 2022-03-06 DIAGNOSIS — I4891 Unspecified atrial fibrillation: Secondary | ICD-10-CM | POA: Diagnosis not present

## 2022-03-06 DIAGNOSIS — I63411 Cerebral infarction due to embolism of right middle cerebral artery: Secondary | ICD-10-CM | POA: Diagnosis not present

## 2022-03-06 LAB — CBC
HCT: 35.3 % — ABNORMAL LOW (ref 39.0–52.0)
Hemoglobin: 12.4 g/dL — ABNORMAL LOW (ref 13.0–17.0)
MCH: 32 pg (ref 26.0–34.0)
MCHC: 35.1 g/dL (ref 30.0–36.0)
MCV: 91.2 fL (ref 80.0–100.0)
Platelets: 288 10*3/uL (ref 150–400)
RBC: 3.87 MIL/uL — ABNORMAL LOW (ref 4.22–5.81)
RDW: 13.3 % (ref 11.5–15.5)
WBC: 10 10*3/uL (ref 4.0–10.5)
nRBC: 0 % (ref 0.0–0.2)

## 2022-03-06 LAB — BASIC METABOLIC PANEL
Anion gap: 8 (ref 5–15)
BUN: 20 mg/dL (ref 8–23)
CO2: 22 mmol/L (ref 22–32)
Calcium: 8.7 mg/dL — ABNORMAL LOW (ref 8.9–10.3)
Chloride: 110 mmol/L (ref 98–111)
Creatinine, Ser: 1.31 mg/dL — ABNORMAL HIGH (ref 0.61–1.24)
GFR, Estimated: 54 mL/min — ABNORMAL LOW (ref >60)
Glucose, Bld: 112 mg/dL — ABNORMAL HIGH (ref 70–99)
Potassium: 3.6 mmol/L (ref 3.5–5.1)
Sodium: 140 mmol/L (ref 135–145)

## 2022-03-06 MED ORDER — ACETAMINOPHEN 325 MG PO TABS: 325.0000 mg | ORAL_TABLET | Freq: Every day | ORAL | 0 refills | Status: DC | PRN

## 2022-03-06 MED ORDER — APIXABAN 5 MG PO TABS: 5.0000 mg | ORAL_TABLET | Freq: Two times a day (BID) | ORAL | 0 refills | Status: AC

## 2022-03-06 MED ORDER — PANTOPRAZOLE SODIUM 40 MG PO PACK: 40.0000 mg | PACK | Freq: Every day | ORAL | 0 refills | Status: DC

## 2022-03-06 MED ORDER — POLYMYXIN B-TRIMETHOPRIM 10000-0.1 UNIT/ML-% OP SOLN
1.0000 [drp] | Freq: Four times a day (QID) | OPHTHALMIC | 0 refills | Status: AC
  Filled 2022-03-06: qty 10, 50d supply, fill #0

## 2022-03-06 MED ORDER — DILTIAZEM HCL 90 MG PO TABS: 90.0000 mg | ORAL_TABLET | Freq: Four times a day (QID) | ORAL | 0 refills | Status: AC

## 2022-03-06 MED ORDER — ATORVASTATIN CALCIUM 40 MG PO TABS: 40.0000 mg | ORAL_TABLET | Freq: Every day | ORAL | 0 refills | Status: DC

## 2022-03-06 MED ORDER — DILTIAZEM HCL 60 MG PO TABS
90.0000 mg | ORAL_TABLET | Freq: Four times a day (QID) | ORAL | Status: DC
  Administered 2022-03-06 (×2): 90 mg via ORAL
  Filled 2022-03-06 (×2): qty 2

## 2022-03-06 NOTE — Progress Notes (Signed)
Physical Therapy Treatment Patient Details Name: [name] [name] MRN: [id number] DOB: 10/12/1940 Today's Date: 03/06/2022   History of Present Illness Pt is an 81 y/o M presenting to ED on 12/2 after ground level fall, LUE negative for fractures, MRI revealing acute/subacute R MCA territory infarct involving R caudate head internal capsule, and lentiform nucleus and diffusely over R temporal, parietal, and posterior frontal lobes. MRI C spine revealing prevertebral fluid from skull base through C6-7 without adjacent bone edema. PMH includes A fib, HLD, GERD, and dementia.    PT Comments    Pt was seen for progression of ROM on LLE to reposition and mitigate tone on LLE with stretch and linen.  Pt was assisted to move LUE to supported/elevated posture as well to reduce edema and to decrease skin risk.  Pt is demonstrating better control with repositioning of extension and flexion driving posture on LLE, and is still at least neutral ROM on DF of L ankle.  Recommend he have positioning boots with soft surfaces to maintain ROM on L ankle for WB activity in the future.  Follow acutely for goals on POC.   Recommendations for follow up therapy are one component of a multi-disciplinary discharge planning process, led by the attending physician.  Recommendations may be updated based on patient status, additional functional criteria and insurance authorization.  Follow Up Recommendations  Skilled nursing-short term rehab (<3 hours/day) Can patient physically be transported by private vehicle: No   Assistance Recommended at Discharge Frequent or constant Supervision/Assistance  Patient can return home with the following Two people to help with walking and/or transfers;Assistance with cooking/housework;Direct supervision/assist for medications management;Direct supervision/assist for financial management;Assist for transportation;Help with stairs or ramp for entrance;Two people to help with  bathing/dressing/bathroom   Equipment Recommendations  None recommended by PT    Recommendations for Other Services       Precautions / Restrictions Precautions Precautions: Fall;Cervical Precaution Booklet Issued: No Precaution Comments: L hemiplegia Required Braces or Orthoses: Cervical Brace Cervical Brace: Soft collar;For comfort;At all times Restrictions Weight Bearing Restrictions: No     Mobility  Bed Mobility               General bed mobility comments: remained in bed    Transfers                        Ambulation/Gait                   Stairs             Wheelchair Mobility    Modified Rankin (Stroke Patients Only) Modified Rankin (Stroke Patients Only) Pre-Morbid Rankin Score: Slight disability Modified Rankin: Severe disability     Balance Overall balance assessment: Needs assistance                                          Cognition Arousal/Alertness: Awake/alert, Lethargic Behavior During Therapy: Flat affect, Restless Overall Cognitive Status: History of cognitive impairments - at baseline                                 General Comments: pt was pointing and gesturing at things that were not there before PT session        Exercises General Exercises - Lower Extremity Ankle Circles/Pumps:  PROM, 5 reps, Other (comment) (management of PF tone in supine) Quad Sets: PROM Hip ABduction/ADduction: PROM    General Comments General comments (skin integrity, edema, etc.): pt was seen for concerns that MD had regarding his L side stiffness and weakness from the stroke as well as expectations for recovery.  Talked with MD about his vision initially and the high tone on LLE vs hypotonic state of LUE      Pertinent Vitals/Pain Pain Assessment Pain Assessment: Faces Faces Pain Scale: Hurts a little bit Pain Location: L LE with ROM Pain Descriptors / Indicators: Discomfort, Grimacing,  Guarding Pain Intervention(s): Limited activity within patient's tolerance, Monitored during session, Premedicated before session, Repositioned    Home Living                          Prior Function            PT Goals (current goals can now be found in the care plan section) Acute Rehab PT Goals Patient Stated Goal: none stated    Frequency    Min 4X/week      PT Plan Discharge plan needs to be updated    Co-evaluation              AM-PAC PT "6 Clicks" Mobility   Outcome Measure  Help needed turning from your back to your side while in a flat bed without using bedrails?: A Lot Help needed moving from lying on your back to sitting on the side of a flat bed without using bedrails?: Total Help needed moving to and from a bed to a chair (including a wheelchair)?: Total Help needed standing up from a chair using your arms (e.g., wheelchair or bedside chair)?: Total Help needed to walk in hospital room?: Total Help needed climbing 3-5 steps with a railing? : Total 6 Click Score: 7    End of Session Equipment Utilized During Treatment: Cervical collar Activity Tolerance: Patient limited by fatigue;Other (comment) (anxiety) Patient left: in bed;with call bell/phone within reach;with bed alarm set;with family/visitor present Nurse Communication: Mobility status PT Visit Diagnosis: Muscle weakness (generalized) (M62.81);Difficulty in walking, not elsewhere classified (R26.2);Pain;Hemiplegia and hemiparesis;Other symptoms and signs involving the nervous system (R29.898) Hemiplegia - Right/Left: Left Hemiplegia - dominant/non-dominant: Non-dominant Hemiplegia - caused by: Cerebral infarction Pain - Right/Left: Left Pain - part of body: Arm;Hip     Time: 6759-1638 PT Time Calculation (min) (ACUTE ONLY): 15 min  Charges:  $Therapeutic Exercise: 8-22 mins       Ramond Dial 03/06/2022, 4:52 PM  Mee Hives, PT PhD Acute Rehab Dept. Number: [facility] and  [place]

## 2022-03-06 NOTE — Discharge Summary (Signed)
Name: [name] [name] MRN: [id number] DOB: Apr 21, 1940 81 y.o. PCP: Tracie Harrier, MD  Date of Admission: 03/01/2022 11:33 AM Date of Discharge: 03/06/2022 Attending Physician: Dr. Angelia Mould  Discharge Diagnosis: Principal Problem:   Cerebrovascular accident (CVA) [facility]) Active Problems:   Atrial fibrillation with RVR ([place])   Fall   Erosive esophagitis   Esophageal ulcer without bleeding   Rhabdomyolysis    Discharge Medications: Allergies as of 03/06/2022   No Known Allergies      Medication List     TAKE these medications    acetaminophen 325 MG tablet Commonly known as: TYLENOL Take 1 tablet (325 mg total) by mouth daily as needed for moderate pain. Patient requires crushed meds. What changed: additional instructions   apixaban 5 MG Tabs tablet Commonly known as: ELIQUIS Take 1 tablet (5 mg total) by mouth 2 (two) times daily. Patient requires crushed meds.   atorvastatin 40 MG tablet Commonly known as: LIPITOR Take 1 tablet (40 mg total) by mouth daily. Patient requires crushed meds. Start taking on: March 07, 2022   diltiazem 90 MG tablet Commonly known as: CARDIZEM Take 1 tablet (90 mg total) by mouth 4 (four) times daily. Patient requires crushed meds.   pantoprazole sodium 40 mg Commonly known as: PROTONIX Take 40 mg by mouth daily.   trimethoprim-polymyxin b ophthalmic solution Commonly known as: POLYTRIM Place 1 drop into both eyes every 6 (six) hours for 2 days.        Disposition and follow-up:   Mr.[name] [name] was discharged from [facility] in Stable condition.  At the hospital follow up visit please address:  1.  Follow-up:  a. CVA: assess function/symptoms, check if taking eliquis and lipitor    b. Afib: check if taking diltiazem and eliquis    c. Esophageal ulcers: assess for symptoms, check if taking PPI  2.  Labs / imaging needed at time of follow-up: CBC  3.  Pending labs/ test needing follow-up:  none  4.  Medication Changes  -Eliquis 5 mg BID  -Lipitor 40 mg daily  -Diltiazem 90 mg QID  -Protonix 40 mg daily  -Polytrim eye drops for more 2 days  Follow-up Appointments:  Contact information for after-discharge care     [facility] .   Service: Skilled Nursing Contact information: [place] [facility] Course by problem list: [name] [name] is a 81 y.o. with a pertinent PMH of prior CVA, mixed Alzheimer's and vascular dementia, Afib not on anticoagulation who presented after fall and admitted for Afib with RVR and acute right MCA stroke.  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 This was unwitnessed, but seen on cameras in his home. There were no acute fractures and patient denied any pain, was found to have ecchymosis of the LUE. Family notes gradual decline and confusion since his fall. MRI showed acute/subacute right MCA infarct and remote lacunar infarcts of left cerebellum. On physical exam he had left sided weakness and was unable to follow commands to move his extremity. He was visualized by nursing staff moving the left upper and lower extremity, so unclear if from CVA or dementia and inability to follow commands. Likely from history of  Afib and not on anticoagulation as patient refused medications. Goals of care conversations were had with son [name] who believes patient would want everything done given major life event. We discussed with patient and son about risks and benefits of anticoagulation after his upper GI bleed. Son verbalized understanding of risks of anticoagulation after GI bleed but prefer to continue with anticoagulation. He was started on eliquis and lipitor. LDL was 92. PT/OT recommended SNF and SLP recommended continued outpatient monitoring,  crushed medications, and puree diet with nectar thick liquids. Will continue Eliquis 5 mg BID and Lipitor 40 mg daily and to work with outpatient PT, OT, and SLP    Hematemesis likely due to esophageal ulcer, resolved Patient had episode of coffee ground emesis 12/3. No further episodes. Hgb stable, 12.4, at time of discharge. EGD showed grade C reflux esophagitis and esophageal ulcers with no active bleeding, awaiting biopsy results. Will continue with PPI therapy.    Atrial fibrillation, rate controlled, previously not on anticoagulation Patient with history of Afib not on anticoagulation due to patient refusal prior to admission. Initially presented with RVR and has been rate controlled with diltiazem. Will continue diltiazem 90 mg QID.    AKI, resolved Rhabdomyolysis  CK 3,449 on admission, improved to 1662 with IV fluids. Admission creatinine 1.62 and now improving after IV fluids and po intake. Prior to admission, creatinine around 1.5-1.7 past few months.    Conjunctivitis  Initially presented with yellow crusting over both eyelids. Started on polytrim opthalmic drops for total 5-7 days.    Abdominal aortic aneurysm CT A/P showed 5 cm infrarenal AAA which measured 4.1 cm on 01/10/2015. Will need follow-up every 6 months with vascular consultation in outpatient setting.   Hx of mixed Alzheimer and vascular dementia Son reports baseline confusion but redirectable. Will carry conversations but at times mumbles. Patient lives alone but family has installed cameras. Son is working on possible nursing facilities but patient has been resistant.   Goals of Care Son is HPOA. Patient had expressed previously about being DNR and will honor his wishes. Had meeting with family and palliative care about GOC. Son believes patient would want medical management for his CVA given this is unexpected and significant event. Will continue discussion about goals of care. Appreciate palliative care team's  assistance.     Pertinent Labs, Studies, and Procedures:     Latest Ref Rng & Units 03/06/2022    3:04 AM 03/05/2022    2:26 AM 03/04/2022    3:27 AM  CBC  WBC 4.0 - 10.5 K/uL 10.0  9.0  9.3   Hemoglobin 13.0 - 17.0 g/dL 12.4  11.5  11.1   Hematocrit 39.0 - 52.0 % 35.3  32.8  32.8   Platelets 150 - 400 K/uL 288  231  215        Latest Ref Rng & Units 03/06/2022    3:04 AM 03/05/2022    2:26 AM 03/04/2022    3:27 AM  CMP  Glucose 70 - 99 mg/dL 112  99  99   BUN 8 - 23 mg/dL '20  23  27   '$ Creatinine 0.61 - 1.24 mg/dL 1.31  1.36  1.39   Sodium 135 - 145 mmol/L 140  139  139   Potassium 3.5 - 5.1 mmol/L 3.6  3.7  3.8   Chloride 98 - 111 mmol/L 110  107  113   CO2 22 - 32 mmol/L '22  20  19   '$ Calcium 8.9 -  10.3 mg/dL 8.7  9.0  8.4     MR Cervical Spine Wo Contrast  Result Date: 03/01/2022 CLINICAL DATA:  Neck trauma. Ligament injury suspected. Patient was found down. EXAM: MRI CERVICAL SPINE WITHOUT CONTRAST TECHNIQUE: Multiplanar, multisequence MR imaging of the cervical spine was performed. No intravenous contrast was administered. COMPARISON:  CT of the cervical spine without contrast 03/01/2022 FINDINGS: Alignment: Slight anterolisthesis at C7-T1 and reversal of the upper cervical lordosis is stable. Vertebrae: Marrow signal and vertebral body heights in the cervical spine are normal. Diffuse decreased marrow signal is present in the skull. No discrete lesions are present. Cord: Normal signal and morphology. Posterior Fossa, vertebral arteries, paraspinal tissues: Craniocervical junction is within normal limits. Prevertebral fluid is evident from the skull base through C6-7. No adjacent bone edema is present. Minimal edema is present along the posterior spinous ligament at the C4-5 level more inferiorly on the left into the paraspinous soft tissues. Disc levels: C2-3: Negative. C3-4: Asymmetric left-sided uncovertebral and facet hypertrophy present. Moderate left and mild right foraminal  narrowing is present. C4-5: A broad-based disc osteophyte complex effaces the ventral CSF. Moderate foraminal narrowing is worse on the left. C5-6: Shallow central disc protrusion partially effaces the ventral CSF. The foramina are patent. C6-7: A broad-based disc osteophyte complex present. Uncovertebral spurring results in moderate foraminal narrowing, right greater than left. C7-T1: Mild facet hypertrophy is present bilaterally. Uncovering of a broad-based disc protrusion is present. No significant stenosis is present. IMPRESSION: 1. Prevertebral fluid from the skull base through C6-7 without adjacent bone edema. This is nonspecific, but can be seen in the setting of acute trauma suggesting anterior ligamentous injury. No associated fracture. 2. Minimal edema along the posterior spinous ligament at the C4-5 level more inferiorly on the left into the paraspinous soft tissues. 3. Moderate left and mild right foraminal narrowing at C3-4. 4. Moderate foraminal narrowing bilaterally at C4-5 is worse on the left. 5. Moderate foraminal narrowing bilaterally at C6-7 is right greater than left. 6. Mild facet hypertrophy and a broad-based disc protrusion at C7-T1 without significant stenosis. Electronically Signed   By: San Morelle M.D.   On: 03/01/2022 17:54   MR BRAIN WO CONTRAST  Result Date: 03/01/2022 CLINICAL DATA:  Head trauma.  Patient found down. EXAM: MRI HEAD WITHOUT CONTRAST TECHNIQUE: Multiplanar, multiecho pulse sequences of the brain and surrounding structures were obtained without intravenous contrast. COMPARISON:  CT head without contrast 03/01/2022 FINDINGS: Brain: The diffusion-weighted images demonstrate no acute right MCA territory infarct. The infarct involves the right caudate head internal capsule and lentiform nucleus. The insular cortex is spared. Cortical and subcortical involvement of the right parietal and temporal lobe is present. Posterior right frontal lobe is involved. Areas of  susceptibility are present within the posterior right lentiform nucleus and caudate head consistent with petechial hemorrhage. Generalized atrophy and white matter disease is otherwise stable. No significant extraaxial fluid collection is present. Remote lacunar infarcts are present within the left cerebellum. Brainstem and cerebellum are otherwise within normal limits. Vascular: Flow is present in the major intracranial arteries. Skull and upper cervical spine: The craniocervical junction is normal. Upper cervical spine is within normal limits. Degenerative changes are present in the upper cervical spine. Craniocervical junction is normal. Marrow signal in the skull is diffusely depressed. No discrete lesions are evident. Sinuses/Orbits: Circumferential mucosal thickening is present in the maxillary sinuses bilaterally. No fluid levels are present. Scattered mucosal thickening is present throughout the ethmoid air cells and left frontal  sinus. Mastoid air cells are clear. Bilateral lens replacements are noted. Globes and orbits are otherwise unremarkable. IMPRESSION: 1. Acute/subacute right MCA territory infarct involving the right caudate head internal capsule and lentiform nucleus as well as more diffusely over the right temporal and parietal lobe as well as the posterior right frontal lobe. 2. Areas of susceptibility within the posterior right lentiform nucleus and caudate head consistent with petechial hemorrhage. 3. Generalized atrophy and white matter disease is otherwise stable. This likely reflects the sequela of chronic microvascular ischemia. 4. Remote lacunar infarcts of the left cerebellum. 5. Diffuse sinus disease. These results were called by telephone at the time of interpretation on 03/01/2022 at 5:48 pm to provider Lita Mains, who verbally acknowledged these results. Electronically Signed   By: San Morelle M.D.   On: 03/01/2022 17:48   DG Forearm Left  Result Date: 03/01/2022 CLINICAL  DATA:  Acute LEFT arm pain following fall. EXAM: LEFT FOREARM - 2 VIEW COMPARISON:  None Available. FINDINGS: There is no evidence of fracture or other focal bone lesions. Soft tissues are unremarkable. IMPRESSION: Negative. Electronically Signed   By: Margarette Canada M.D.   On: 03/01/2022 13:23   DG Humerus Left  Result Date: 03/01/2022 CLINICAL DATA:  Fall.  Left humerus pain. EXAM: LEFT HUMERUS - 2+ VIEW COMPARISON:  None Available. FINDINGS: Edema or laceration noted at the elbow. No underlying fracture or foreign body. Humerus is unremarkable. Shoulder is located. IMPRESSION: 1. Edema or laceration at the elbow without underlying fracture or foreign body. 2. No acute osseous abnormality of the left humerus. Electronically Signed   By: San Morelle M.D.   On: 03/01/2022 13:22   CT Maxillofacial Wo Contrast  Result Date: 03/01/2022 CLINICAL DATA:  Facial trauma, blunt. Patient found on the floor unresponsive. Fall recorded. EXAM: CT MAXILLOFACIAL WITHOUT CONTRAST TECHNIQUE: Multidetector CT imaging of the maxillofacial structures was performed. Multiplanar CT image reconstructions were also generated. RADIATION DOSE REDUCTION: This exam was performed according to the departmental dose-optimization program which includes automated exposure control, adjustment of the mA and/or kV according to patient size and/or use of iterative reconstruction technique. COMPARISON:  None Available. FINDINGS: Osseous: No acute fracture is present. The mandible is intact and located. Nasal bones are within normal limits. Orbits: Bilateral lens replacements are noted. Globes and orbits are otherwise unremarkable. Sinuses: Moderate mucosal thickening is present within the left greater than right maxillary sinus. No fluid levels are present. Mild mucosal thickening is present in the left frontal sinus. The paranasal sinuses and mastoid air cells are otherwise clear. Soft tissues: Diffuse soft tissue swelling is present over  the left side of the face. Periorbital soft tissue swelling is present without underlying fracture. No foreign body is present. Stranding extends into the left neck. No focal laceration or fluid collection is present. Limited intracranial: Within normal limits. IMPRESSION: 1. Diffuse soft tissue swelling over the left side of the face without underlying fracture. 2. Periorbital soft tissue swelling without underlying fracture. 3. No foreign body. 4. Moderate mucosal thickening within the left greater than right maxillary sinus. No fluid levels are present. Electronically Signed   By: San Morelle M.D.   On: 03/01/2022 13:05   CT CHEST ABDOMEN PELVIS W CONTRAST  Result Date: 03/01/2022 CLINICAL DATA:  Trauma EXAM: CT CHEST, ABDOMEN, AND PELVIS WITH CONTRAST TECHNIQUE: Multidetector CT imaging of the chest, abdomen and pelvis was performed following the standard protocol during bolus administration of intravenous contrast. RADIATION DOSE REDUCTION: This exam was  performed according to the departmental dose-optimization program which includes automated exposure control, adjustment of the mA and/or kV according to patient size and/or use of iterative reconstruction technique. CONTRAST:  28m OMNIPAQUE IOHEXOL 350 MG/ML SOLN COMPARISON:  CT abdomen and pelvis done on 01/10/2015 FINDINGS: CT CHEST FINDINGS Cardiovascular: Coronary artery calcifications are seen. Dense calcification is seen in mitral annulus. Major vascular structures in mediastinum appear intact. Mediastinum/Nodes: There is no mediastinal hematoma. Lungs/Pleura: There is no focal pulmonary consolidation. There is no pleural effusion or pneumothorax. Few blebs are seen in the apices. Musculoskeletal: No acute findings are seen in bony structures in the thorax. Deformity is seen in the anterolateral aspect of left eleventh rib suggests old fracture. There is deformity in the anterior end of left ninth rib, possibly residual from previous injury.  CT ABDOMEN PELVIS FINDINGS Hepatobiliary: There is no demonstrable focal laceration. There is mild prominence of intrahepatic bile ducts. Gallbladder is not distended. Pancreas: No focal abnormalities are seen. Spleen: Unremarkable. Adrenals/Urinary Tract: Adrenals are unremarkable. There is no hydronephrosis. There is no demonstrable cortical laceration. There is no significant perinephric fluid collection. There are no renal or ureteral stones. Urinary bladder is unremarkable. Stomach/Bowel: Small hiatal hernia is seen. Stomach is unremarkable. Small bowel loops are not dilated. Appendix is not dilated. Multiple diverticula are seen in colon without signs of focal wall thickening. There is no pericolic stranding. Vascular/Lymphatic: There is 5 cm infrarenal abdominal aortic aneurysm which measured 4.1 cm in the previous study. Part of the lumen of aneurysm is filled with thrombus. There is no evidence of retroperitoneal hematoma. Extensive scattered calcifications are seen in aorta and its major branches. Reproductive: Unremarkable. Other: There is no ascites or pneumoperitoneum. Bilateral inguinal hernias containing fat are noted, larger on the left side. Small umbilical hernia containing fat is seen. There is mild stranding in subcutaneous plane adjacent to the left iliac bone, possibly soft tissue contusion without any demonstrable large hematoma. Musculoskeletal: Degenerative changes are noted in sternoclavicular joints, cervical spine, thoracic spine and lumbar spine. There is minimal anterolisthesis at L4-L5 level. There is spinal stenosis and encroachment of neural foramina is seen at multiple levels in the lumbar spine. IMPRESSION: No acute findings are seen in CT scan of chest, abdomen and pelvis. There is no mediastinal or retroperitoneal hematoma. There is no focal pulmonary consolidation. There is no demonstrable laceration in the solid organs. There is no focal bowel wall thickening. There is no  ascites or pneumoperitoneum. Coronary artery disease. There is 5 cm infrarenal abdominal aortic aneurysm which measured 4.1 cm in the study done on 01/10/2015. Recommend follow-up every 6 months and vascular consultation.Reference: J Am Coll Radiol 28413;[id number] Diverticulosis of colon. Other findings as described in the body of the report. Electronically Signed   By: PElmer PickerM.D.   On: 03/01/2022 12:56   CT Cervical Spine Wo Contrast  Result Date: 03/01/2022 CLINICAL DATA:  Neck trauma EXAM: CT CERVICAL SPINE WITHOUT CONTRAST TECHNIQUE: Multidetector CT imaging of the cervical spine was performed without intravenous contrast. Multiplanar CT image reconstructions were also generated. RADIATION DOSE REDUCTION: This exam was performed according to the departmental dose-optimization program which includes automated exposure control, adjustment of the mA and/or kV according to patient size and/or use of iterative reconstruction technique. COMPARISON:  12/05/2015 FINDINGS: Alignment: No traumatic malalignment. Mild C7-T1 anterolisthesis that was also seen previously. Skull base and vertebrae: No acute fracture or aggressive bone lesion. Generalized osteopenia. Soft tissues and spinal canal: Prevertebral and left lateral neck  edema in the upper cervical spine. The lateral neck involvement is atypical for a primary cervical spine injury, question direct contusion. Disc levels: Bony fusion from C2-C6. Advanced adjacent segment disc degeneration at C6-7 and facet degeneration at C1-2 especially on the left where there is likely C2 nerve root impingement. Degenerative facet spurring causes mild anterolisthesis. Upper chest: Reported separately IMPRESSION: 1. Prevertebral and left lateral neck edema in the cervical spine without cervical spine fracture or acute malalignment. If no direct trauma to this area consider cervical MRI to evaluate for soft tissue/ligamentous injury. 2. Cervical spine fusion from C2  to C6. Advanced cervical spine degeneration. Electronically Signed   By: Jorje Guild M.D.   On: 03/01/2022 12:45   CT HEAD WO CONTRAST (5MM)  Result Date: 03/01/2022 CLINICAL DATA:  Trauma EXAM: CT HEAD WITHOUT CONTRAST TECHNIQUE: Contiguous axial images were obtained from the base of the skull through the vertex without intravenous contrast. RADIATION DOSE REDUCTION: This exam was performed according to the departmental dose-optimization program which includes automated exposure control, adjustment of the mA and/or kV according to patient size and/or use of iterative reconstruction technique. COMPARISON:  03/21/2008 FINDINGS: Brain: No acute intracranial findings are seen. There are no signs of bleeding within the cranium. Cortical sulci are prominent. There is a encephalomalacia in the left frontoparietal cortex, possibly old infarct. There is decreased density in periventricular and subcortical white matter suggesting small-vessel disease. Vascular: Unremarkable. Skull: No fracture is seen in calvarium. There is subcutaneous hematoma in the anterior frontal and left parietal scalp. Sinuses/Orbits: There is mucosal thickening in ethmoid and maxillary sinuses. There are no air-fluid levels. Other: None. IMPRESSION: No acute intracranial findings are seen in noncontrast CT brain. There is subcutaneous hematoma in the frontal and left parietal scalp. No fracture is seen in calvarium. 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 Small-vessel disease. Atrophy. Electronically Signed   By: Elmer Picker M.D.   On: 03/01/2022 12:33   DG Pelvis Portable  Result Date: 03/01/2022 CLINICAL DATA:  Level 1 trauma.  Patient found down. EXAM: PORTABLE PELVIS 1-2 VIEWS COMPARISON:  CT the abdomen and pelvis 01/10/2015 FINDINGS: No acute fractures are present. Hips appear located bilaterally. Degenerative changes are present in the lower lumbar spine. IMPRESSION: 1. No acute fracture or dislocation. 2.  Degenerative changes in the lower lumbar spine. Electronically Signed   By: San Morelle M.D.   On: 03/01/2022 12:00   DG Chest Portable 1 View  Result Date: 03/01/2022 CLINICAL DATA:  Level 1 trauma.  Patient found down. EXAM: PORTABLE CHEST 1 VIEW COMPARISON:  None Available. FINDINGS: The heart size and mediastinal contours are within normal limits. Both lungs are clear. The visualized skeletal structures are unremarkable. IMPRESSION: No active disease. Electronically Signed   By: San Morelle M.D.   On: 03/01/2022 11:59     Discharge Instructions: Discharge Instructions     Call MD for:  difficulty breathing, headache or visual disturbances   Complete by: As directed    Call MD for:  extreme fatigue   Complete by: As directed    Call MD for:  hives   Complete by: As directed    Call MD for:  persistant dizziness or light-headedness   Complete by: As directed    Call MD for:  persistant nausea and vomiting   Complete by: As directed    Call MD for:  redness, tenderness, or signs of infection (pain, swelling, redness, odor or green/yellow discharge around incision site)  Complete by: As directed    Call MD for:  severe uncontrolled pain   Complete by: As directed    Call MD for:  temperature >100.4   Complete by: As directed    DIET - DYS 1   Complete by: As directed    Fluid consistency: Nectar Thick   Increase activity slowly   Complete by: As directed        Signed: Angelique Blonder, DO 03/06/2022, 3:19 PM   Pager: [phone]

## 2022-03-06 NOTE — Progress Notes (Addendum)
HD#4 Subjective:   Summary: [name] [name] is a 81 y.o. male with PMH of prior CVA, mixed Alzheimer's and vascular dementia, Afib not on anticoagulation who presents after fall and admitted for Afib with RVR and acute right MCA stroke.  Overnight Events: none  Patient remains oriented to self. At times answers questions appropriately. Still neglecting left side. Denies any pain or other concerns. Speech at bedside today and recommended downgrading to puree diet.   Objective:  Vital signs in last 24 hours: Vitals:   03/05/22 1315 03/05/22 2014 03/06/22 0415 03/06/22 0437  BP: 130/83 129/89 134/83 134/83  Pulse: (!) 110 (!) 109 (!) 107 (!) 107  Resp: '19 18 18 18  '$ Temp:  97.9 F (36.6 C) 98.4 F (36.9 C) 98.4 F (36.9 C)  TempSrc:  Axillary Oral   SpO2: 96% 97% 96%   Weight:      Height:       Supplemental O2: Room Air SpO2: 96 % O2 Flow Rate (L/min): 2 L/min   Physical Exam:  Constitutional: alert, laying in bed, in no acute distress HENT: normocephalic  Cardiovascular: irregular rhythm, tachycardic  Pulmonary/Chest: normal work of breathing on room air Neurological: alert and oriented x 1 to person, able to follow some commands on right side with notable left-sided neglect Skin: warm and dry, bruising over left UE   Filed Weights   03/01/22 1222 03/01/22 1851 03/03/22 0854  Weight: 75 kg 74.1 kg 74.1 kg     Intake/Output Summary (Last 24 hours) at 03/06/2022 1332 Last data filed at 03/06/2022 0437 Gross per 24 hour  Intake 260 ml  Output 1610 ml  Net -1350 ml   Net IO Since Admission: 1,315.94 mL [03/06/22 1332]  Pertinent Labs:    Latest Ref Rng & Units 03/06/2022    3:04 AM 03/05/2022    2:26 AM 03/04/2022    3:27 AM  CBC  WBC 4.0 - 10.5 K/uL 10.0  9.0  9.3   Hemoglobin 13.0 - 17.0 g/dL 12.4  11.5  11.1   Hematocrit 39.0 - 52.0 % 35.3  32.8  32.8   Platelets 150 - 400 K/uL 288  231  215        Latest Ref Rng & Units 03/06/2022    3:04 AM  03/05/2022    2:26 AM 03/04/2022    3:27 AM  CMP  Glucose 70 - 99 mg/dL 112  99  99   BUN 8 - 23 mg/dL '20  23  27   '$ Creatinine 0.61 - 1.24 mg/dL 1.31  1.36  1.39   Sodium 135 - 145 mmol/L 140  139  139   Potassium 3.5 - 5.1 mmol/L 3.6  3.7  3.8   Chloride 98 - 111 mmol/L 110  107  113   CO2 22 - 32 mmol/L '22  20  19   '$ Calcium 8.9 - 10.3 mg/dL 8.7  9.0  8.4     Imaging: No results found.  Assessment/Plan:   Principal Problem:   Cerebrovascular accident (CVA) ([place]) Active Problems:   Atrial fibrillation with RVR ([place])   Fall   Erosive esophagitis   Esophageal ulcer without bleeding   Rhabdomyolysis   Patient Summary: [name] [name] is a 81 y.o. with a pertinent PMH of prior CVA, mixed Alzheimer's and vascular dementia, Afib not on anticoagulation who presented after fall and admitted for Afib with RVR and acute right MCA stroke.  Fall Acute right MCA CVA Hx of prior  CVA Able to follow some commands for right side but unable on left. Left arm remains flaccid. Some resistance with left LE with ROM. Working with PT and OT. Speech downgraded him to Dys 1 diet with nectar thick fluid, concerned for aspiration risk. Pending SNF placement.  -appreciate neurology assistance with his care -Eliquis 5 mg BID -Lipitor 40 mg daily -appreciate PT/OT/SLP assistance in care -telemetry monitoring -neuro checks  Hematemesis, resolved  Patient had episode of coffee ground emesis 12/3. No further episodes.  Hgb improving, 12.4. EGD showed grade C reflux esophagitis and esophageal ulcers with no active bleeding, awaiting biopsy results.  -appreciate GI assistance in care   -IV Protonix 40 mg q12h  Atrial fibrillation, previously not on anticoagulation  Patient with history of Afib not on anticoagulation due to patient refusal prior to admission. Initially presented with RVR. Had been rate controlled with diltiazem. At times tachycardic, may benefit from increasing diltiazem dose.   -increase diltiazem to 90 mg QID, reduce dose if BP not tolerating   AKI, resolved Rhabdomyolysis  CK 3,449 on admission, improved to 1662 with IV fluids. Admission creatinine 1.62 and now improving after IV fluids and po intake. Prior to admission, creatinine around 1.5-1.7 past few months.   Conjunctivitis  Very little crusting noted over both eyes. Will continue polytrim opthalmic drops for 1-2 more days.   Abdominal aortic aneurysm CT A/P showed 5 cm infrarenal AAA which measured 4.1 cm on 01/10/2015. Will need follow-up every 6 months with vascular consultation in outpatient setting.   Hx of mixed Alzheimer and vascular dementia Son reports baseline confusion but redirectable. Will carry conversations but at times mumbles. Patient lives alone but family has installed cameras. Son is working on possible nursing facilities but patient has been resistant.   Goals of Care Son is HPOA. Patient had expressed previously about being DNR and will honor his wishes. Had meeting with family and palliative care about GOC. Son believes patient would want medical management for his CVA given this is unexpected and significant event. Will continue discussion about goals of care. Appreciate palliative care team's assistance.     Diet:  Dys 1 (with nectar think fluids) IVF: None,None VTE: DOAC Code: DNR PT/OT recs:  acute inpatient rehab TOC recs: pending SNF placement Family Update: TOC provided patient's son with SNF options, pending decision   Dispo: pending SNF placement    Angelique Blonder, DO Internal Medicine Resident PGY-1 Pager: [phone] Please contact the on call pager after 5 pm and on weekends at [phone].

## 2022-03-06 NOTE — TOC Progression Note (Addendum)
Transition of Care [facility]) - Progression Note    Patient Details  Name: [name] [name] MRN: [id number] Date of Birth: 1941/01/29  Transition of Care [facility]) CM/SW [place], [place] Phone Number: 03/06/2022, 11:19 AM  Clinical Narrative:     Update- Tanya with [facility] confirmed patient can dc over today if medically ready. CSW informed MD.  Update: CSW spoke patients son [name] [name] and [name] [name] accepted SNF bed offer with [place] [facility] for patient. CSW spoke with [facility] who confirmed SNF bed for patient. CSW informed MD.   CSW provided SNF bed offers to patients son for review. CSW will follow back up with patients son this afternoon with SNF choice. CSW will continue to follow and assist with patients dc planning needs.  Expected Discharge Plan: [place] Barriers to Discharge: Continued Medical Work up  Expected Discharge Plan and Services Expected Discharge Plan: [place] In-house Referral: Clinical Social Work     Living arrangements for the past 2 months: Single Family Home                                       Social Determinants of Health (SDOH) Interventions    Readmission Risk Interventions     No data to display

## 2022-03-06 NOTE — Care Management Important Message (Signed)
Important Message  Patient Details  Name: Chun Sellen MRN: [id number] Date of Birth: 06/05/1940   Medicare Important Message Given:  Yes     Shelda Altes 03/06/2022, 12:58 PM

## 2022-03-06 NOTE — Progress Notes (Addendum)
Speech Language Pathology Treatment: Dysphagia  Patient Details Name: [name] [name] MRN: [id number] DOB: 09/02/1940 Today's Date: 03/06/2022 Time: 0940-1005 SLP Time Calculation (min) (ACUTE ONLY): 25 min  Assessment / Plan / Recommendation Clinical Impression  Pt seen at bedside for skilled ST intervention targeting goals for safe swallow and tolerance of least restrictive diet. Pt was seated upright, resting in bed. No family present. Pt was noted to have poor eye contact with right gaze preference, and had difficulty following commands. Several times pt would utter an intelligible phrase, followed by incoherent jargon. Unknown if this is baseline. Pt required assistance to feed puree consistencies, and significant cueing to take sips of NTL via straw. More solid textures on tray were not presented at this time, due to increased aspiration risk.  Pt was unable to follow directions for CN exam, but when SLP placed 2 fingers in pt's left and right hands, he pulled hard to let go, slapping his left thigh as his hand went down. MDs arrived for their assessment, and were informed of pt presentation this date. Recommend downgrading diet to puree and nectar thick liquid to maximize safe PO intake. SLP will continue to follow.   HPI HPI: Pt is an 81 y/o M presenting to ED on 12/2 after ground level fall. LUE negative for fractures, MRI revealing acute/subacute R MCA territory infarct involving R caudate head internal capsule, and lentiform nucleus and diffusely over R temporal, parietal, and posterior frontal lobes. MRI C spine revealing prevertebral fluid from skull base through C6-7 without adjacent bone edema. PMH includes A fib, HLD, GERD, and dementia. Son, [name], says that pt's cognition is impaired at baseline but that he can eat and drink normally.      SLP Plan  Continue with current plan of care      Recommendations for follow up therapy are one component of a multi-disciplinary discharge  planning process, led by the attending physician.  Recommendations may be updated based on patient status, additional functional criteria and insurance authorization.    Recommendations  Diet recommendations: Nectar-thick liquid;Dysphagia 1 (puree) Liquids provided via: Straw Medication Administration: Crushed with puree Supervision: Full supervision/cueing for compensatory strategies;Trained caregiver to feed patient Compensations: Minimize environmental distractions;Slow rate;Small sips/bites Postural Changes and/or Swallow Maneuvers: Seated upright 90 degrees;Upright 30-60 min after meal                Oral Care Recommendations: Oral care QID Follow Up Recommendations: Other (comment) (TBD) Assistance recommended at discharge: Frequent or constant Supervision/Assistance SLP Visit Diagnosis: Dysphagia, unspecified (R13.10) Plan: Continue with current plan of care          Iasha Mccalister B. Quentin Ore, [facility] [place], [place] Speech Language Pathologist Office: [phone]  Shonna Chock 03/06/2022, 10:13 AM

## 2022-03-06 NOTE — TOC Transition Note (Addendum)
Transition of Care [facility]) - CM/SW Discharge Note   Patient Details  Name: [name] [name] MRN: [id number] Date of Birth: 20-May-1940  Transition [facility] Care [facility]) CM/SW Contact:  Milas Gain, [place] Phone Number: 03/06/2022, 3:31 PM   Clinical Narrative:     Patient will DC to: [facility]   Anticipated DC date: 03/06/2022  Family notified: Nurse, mental health by: Corey Harold  ?  Per MD patient ready for DC to [facility] . RN, patient, patient's family, and facility notified of DC. Discharge Summary sent to facility. RN given number for report tele# [phone] RM# [phone]. DC packet on chart. DNR signed by MD attached to patients DC packet.Ambulance transport requested for patient.  CSW signing off.   Final next level of care: Skilled Nursing Facility Barriers to Discharge: No Barriers Identified   Patient Goals and CMS Choice   CMS Medicare.gov Compare Post Acute Care list provided to:: Patient Represenative (must comment) (Patients son [name]) Choice offered to / list presented to : Adult Children (Patients son [name])  Discharge Placement              Patient chooses bed at: [facility] Patient to be transferred to facility by: [place] Name of family member notified: [name] Patient and family notified of of transfer: 03/06/22  Discharge Plan and Services In-house Referral: Clinical Social Work                                   Social Determinants of Health (SDOH) Interventions     Readmission Risk Interventions     No data to display

## 2022-03-06 NOTE — Progress Notes (Signed)
Report given to [place] at [facility]. Questions answered. Denied any other questions. Report to [facility] RN and made aware that facility was contacted and report given to them.

## 2022-03-07 ENCOUNTER — Encounter (HOSPITAL_COMMUNITY): Payer: Self-pay | Admitting: Gastroenterology

## 2022-04-02 ENCOUNTER — Encounter: Payer: Self-pay | Admitting: Nurse Practitioner

## 2022-04-02 ENCOUNTER — Non-Acute Institutional Stay: Payer: Medicare Other | Admitting: Nurse Practitioner

## 2022-04-02 VITALS — BP 153/87 | HR 82 | Temp 97.3°F | Resp 18 | Wt 161.8 lb

## 2022-04-02 DIAGNOSIS — I639 Cerebral infarction, unspecified: Secondary | ICD-10-CM

## 2022-04-02 DIAGNOSIS — Z515 Encounter for palliative care: Secondary | ICD-10-CM

## 2022-04-02 DIAGNOSIS — R5381 Other malaise: Secondary | ICD-10-CM

## 2022-04-02 NOTE — Progress Notes (Signed)
[facility] Palliative Care Consult Note Telephone: [phone]  Fax: [phone]   Date of encounter: 04/02/22 3:56 PM PATIENT NAME: [name] [name] [address] [place] [address]   [phone] (home)  DOB: 1940/05/29 MRN: [id number] PRIMARY CARE PROVIDER:   [place], MD,  [address] [place] [address] [phone]  REFERRING PROVIDER:  [facility] Tracie Harrier, MD [address] [place],  [place] [address] [phone]  RESPONSIBLE PARTY:    Contact Information     Name Relation Home Work Mobile   Jamil, [place]   [phone]      I met face to face with patient in facility. Palliative Care was asked to follow this patient by consultation request of  Tracie Harrier, MD/AHCC to address advance care planning and complex medical decision making. This is the initial visit.                                ASSESSMENT AND PLAN / RECOMMENDATIONS:  Advance Care Planning/Goals of Care: Goals include to maximize quality of life and symptom management. Patient/health care surrogate gave his/her permission to discuss.Our advance care planning conversation included a discussion about:    The value and importance of advance care planning  Experiences with loved ones who have been seriously ill or have died  Exploration of personal, cultural or spiritual beliefs that might influence medical decisions  Exploration of goals of care in the event of a sudden injury or illness  Identification  of a healthcare agent  Review and updating or creation of an  advance directive document . Decision not to resuscitate or to de-escalate disease focused treatments due to poor prognosis. CODE STATUS: DNR  Symptom Management/Plan: 1. Advance Care Planning;  DNR [name] [name] son, [name] and I talked about wishes to focus on minimize suffering. Discussed  Medicare benefit of hospice services what that provides, how the program works. We talked about current under STR and [name] Klem likely will need to continue to monitor through rehab to see if continues to decline to the point where [name] [name] meets criteria for hospice.  2. Goals of Care: Goals include to maximize quality of life and symptom management. Our advance care planning conversation included a discussion about:    The value and importance of advance care planning  Exploration of personal, cultural or spiritual beliefs that might influence medical decisions  Exploration of goals of care in the event of a sudden injury or illness  Identification and preparation of a healthcare agent  Review and updating or creation of an advance directive document. 3. Palliative care encounter; Palliative care encounter; Palliative medicine team will continue to support patient, patient's family, and medical team. Visit consisted of counseling and education dealing with the complex and emotionally intense issues of symptom management and palliative care in the setting of serious and potentially life-threatening illness  4. Debility secondary to CVA, reviewed therapy, continue to encourage [name] Bolio participation in therapy as time will tell about new baseline, believe at this time still too early to know. 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 Information provided. Continue positive re-enforcement,  encourage mobility  5. Anorexia; weights reviewed, continue to weight. Monitor appetite, [name] [name] does require to be fed, supplements, encourage meals. Discussed at length with son about swallowing, anorexia, weights. Will continue to monitor. 03/06/2022 weight 161.8 lbs   Follow  up Palliative Care Visit: Palliative care will continue to follow for complex medical decision making, advance care planning, and clarification of goals. Return 1 to 4 weeks or prn.  I spent 60 minutes providing this consultation. More than 50% of the time in this consultation was spent in counseling and care coordination. PPS: 40%  Chief Complaint: Initial palliative consult for complex medical decision making, address goals, manage ongoing symptoms  HISTORY OF PRESENT ILLNESS:  [name] [name] is a 82 y.o. 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 Did have hematemesis likely due to esophageal ulcer which resolved, EGD was done showed grade C reflux esophagitis and esophageal ulcer with no active bleed, biopsy done though remains pending. AKI resolved/rhabdomyloysis; afib with controlled rate. [name] [name] was stabilized and d/c to [facility] for STR where he is currently residing. [name] [name] is non-ambulatory, requires total assistance for all adl's, incontinence bowel and bladder, requires to be fed. [name] [name] continue to try PT/OT therapy with some progress. Staff endorses it is difficult to understand [name] Poet speech. At present [name] [name] is lying in bed, appears debilitated, comfortable. Room-mate is present. [name] [name] does make eye contact, able to answer simple questions, difficulty getting thoughts together, difficulty with expressive language. [name] [name] was cooperative with assessment, limited discussion with impairment. [name] [name] share for New Years, they watched TV and [name] [name]  was waving his arm to the music. Support provided. I called [name] Avino son, [name]. Clinical update and PC visit discussed.  We talked about the last time [name] Pooley was independent which was prior to this stroke/hospitalization. [name] Leasure did have difficulty with memory but was able to function independently except driving, he was getting lost so family had license revoked. Family was bringing him his meals and he was eating. [name] Delbene was able to take a bath, get dressed, slept about 16 hrs a day, disinterested in daily routine. We talked about past medical history, acute stroke, family and social dynamics. 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 [name] is HCPOA. We talked about current functional abilities which [name] Schwarz is currently total care, non-ambulatory, requires to bathed, dressed, fed with declined appetite, incontinence wearing adult diapers. We talked about level of care, slow progress with therapy. We talked about what happens following STR with he will likely require LTC placement. We talked about cognitive abilities, as he is oriented to self, place though more difficulty with expressive language. Difficulty processing, getting words out. We talked about challenges for a strong independent man with loss of independence, depression, failure to thrive, anorexia, weights, nutrition, realistic expectations for moving forward. We talked about different resources including veterans services, elder attorney, hospice under medicare benefit though at present time may have not declined enough to meet criteria, will need to follow closely. We talked about [facility] SW as resource, asking  about STR days. We talked about medical goals, poc, medications reviewed. We talked about role pc in poc. Contact information provided. Therapeutic listening, emotional support provided. Will continue to follow, next visit within a week with further discussions, evaluation for decline, progress in  therapy, appetite, symptoms. Updated staff.   History obtained from review of EMR, discussion with primary team, and interview with family, facility staff/caregiver and/or [name]. [facility].  I reviewed available labs, medications, imaging, studies and related documents from the EMR.  Records reviewed and summarized above.   ROS 10 point system reviewed with staff, [name] Hopfensperger, son, Yossef +anorexia, +generalized weakness  Physical Exam: Constitutional: NAD General: frail appearing, thin, debilitated male EYES: lids intact ENMT: oral mucous membranes moist CV: S1S2, RRR Pulmonary: LCTA Abdomen: normo-active BS + 4 quadrants, soft and non tender MSK: non-ambulatory Skin: warm and dry Neuro:  hemiplegia,  + cognitive impairment Psych: flat affect, A and Oriented to person CURRENT PROBLEM LIST:  Patient Active Problem List   Diagnosis Date Noted   Contusion of left forearm 03/06/2022   Rhabdomyolysis 03/04/2022   Erosive esophagitis 03/03/2022   Esophageal ulcer without bleeding 03/03/2022   Fall 03/02/2022   Atrial fibrillation with RVR ([place]) 03/01/2022   Cerebrovascular accident (CVA) ([place]) 12/06/2015   PAST MEDICAL HISTORY:  Active Ambulatory Problems    Diagnosis Date Noted   Cerebrovascular accident (CVA) ([place]) 12/06/2015   Atrial fibrillation with RVR ([place]) 03/01/2022   Fall 03/02/2022   Erosive esophagitis 03/03/2022   Esophageal ulcer without bleeding 03/03/2022   Rhabdomyolysis 03/04/2022   Contusion of left forearm 03/06/2022   Resolved Ambulatory Problems    Diagnosis Date Noted   Coffee ground emesis 03/02/2022   Past Medical History:  Diagnosis Date   Arthritis    Cataracts, bilateral    Chickenpox    Dyslipidemia    GERD (gastroesophageal reflux disease)    Rheumatoid arthritis ([place])    SOCIAL HX:  Social History   Tobacco Use   Smoking status: Former    Types: Cigarettes    Quit date: 10/25/1988    Years since quitting: 33.4   Smokeless tobacco: Never   Substance Use Topics   Alcohol use: No   FAMILY HX:  Family History  Problem Relation Age of Onset   Stroke Mother       ALLERGIES: No Known Allergies   PERTINENT MEDICATIONS:  Outpatient Encounter Medications as of 04/02/2022  Medication Sig   acetaminophen (TYLENOL) 325 MG tablet Take 1 tablet (325 mg total) by mouth daily as needed for moderate pain. Patient requires crushed meds.   apixaban (ELIQUIS) 5 MG TABS tablet Take 1 tablet (5 mg total) by mouth 2 (two) times daily. Patient requires crushed meds.   atorvastatin (LIPITOR) 40 MG tablet Take 1 tablet (40 mg total) by mouth daily. Patient requires crushed meds.   diltiazem (CARDIZEM) 90 MG tablet Take 1 tablet (90 mg total) by mouth 4 (four) times daily. Patient requires crushed meds.   pantoprazole sodium (PROTONIX) 40 mg Take 40 mg by mouth daily.   No facility-administered encounter medications on file as of 04/02/2022.   Thank you for the opportunity to participate in the care of [name]. [facility].  The palliative care team will continue to follow. Please call our office at [phone] if we can be of additional assistance.   Jaeven Wanzer Z Breianna Delfino, NP ,

## 2022-04-07 ENCOUNTER — Non-Acute Institutional Stay: Payer: Medicare Other | Admitting: Nurse Practitioner

## 2022-04-07 ENCOUNTER — Encounter: Payer: Self-pay | Admitting: Nurse Practitioner

## 2022-04-07 VITALS — BP 103/65 | HR 94 | Temp 97.3°F | Resp 18 | Wt 161.8 lb

## 2022-04-07 DIAGNOSIS — Z515 Encounter for palliative care: Secondary | ICD-10-CM

## 2022-04-07 DIAGNOSIS — R5381 Other malaise: Secondary | ICD-10-CM

## 2022-04-07 DIAGNOSIS — I639 Cerebral infarction, unspecified: Secondary | ICD-10-CM

## 2022-04-07 NOTE — Progress Notes (Signed)
[facility] Consult Note Telephone: [phone]  Fax: [phone]    Date of encounter: 04/07/22 2:12 PM PATIENT NAME: Ottie Tillery [address] [place] [address]   [phone] (home)  DOB: 11-05-1940 MRN: [id number] PRIMARY CARE PROVIDER:   [place], MD,  [address] [place] [address] [phone]  RESPONSIBLE PARTY:    Contact Information     Name Relation Home Work Mobile   Rishav, Rockefeller   [phone]     I met face to face with patient in facility. Palliative Care was asked to follow this patient by consultation request of  Tracie Harrier, MD/AHCC to address advance care planning and complex medical decision making. This is the initial visit.                                ASSESSMENT AND PLAN / RECOMMENDATIONS:  Advance Care Planning/Goals of Care: Goals include to maximize quality of life and symptom management. Patient/health care surrogate gave his/her permission to discuss.Our advance care planning conversation included a discussion about:    The value and importance of advance care planning  Experiences with loved ones who have been seriously ill or have died  Exploration of personal, cultural or spiritual beliefs that might influence medical decisions  Exploration of goals of care in the event of a sudden injury or illness  Identification  of a healthcare agent  Review and updating or creation of an  advance directive document . Decision not to resuscitate or to de-escalate disease focused treatments due to poor prognosis. CODE STATUS: DNR   Symptom Management/Plan: 1. Advance Care Planning;  DNR  2. Palliative care encounter; Palliative care encounter; Palliative medicine team will continue to support patient, patient's family, and medical team. Visit consisted of counseling and education dealing with the complex and emotionally  intense issues of symptom management and palliative care in the setting of serious and potentially life-threatening illness   3. 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 Anorexia; weights reviewed, continue to weight. Monitor appetite, Mr Hattabaugh does require to be fed, supplements, encourage meals. Discussed at length with son about swallowing, anorexia, weights. Will continue to monitor. 03/06/2022 weight 161.8 lbs   no new weight Follow up Palliative Care Visit: Palliative care will continue to follow for complex medical decision making, advance care planning, and clarification of goals. Return 1 to 4 weeks or prn.   I spent 35 minutes providing this consultation. More than 50% of the time in this consultation was spent in counseling and care coordination. PPS: 40%   Chief Complaint: Initial palliative consult for complex medical decision making, address goals, manage ongoing symptoms   HISTORY OF PRESENT ILLNESS:  Zigmond Trela is a 82 y.o. 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 Did have hematemesis likely due to esophageal ulcer which resolved, EGD was done showed grade C reflux esophagitis and esophageal ulcer with no active bleed, biopsy done  though remains pending. AKI resolved/rhabdomyloysis; afib with controlled rate. Mr Coster was stabilized and d/c to [facility] for STR where he is currently residing. Mr Orndoff is non-ambulatory, requires total assistance for all adl's, incontinence bowel and  bladder, requires to be fed. Mr Difatta continue to try PT/OT therapy with some progress. Staff endorses it is difficult to understand Mr Dinino speech. At present Mr Baxendale is lying in bed, appears debilitated, comfortable. Mr Somers does mumble words with few clear words, mostly "thank you". Mr Esty was cooperative with assessment, difficulty getting words expressed. Supportive visit. Medical goals, poc, medications reviewed. Mr Benish continues to appear to overall decline functionally, cognitively, called and message left for [name], Mr Wease son for update on clinical status. [name] returned call, clinical update given, discussed that through the weekend, [name] endorses he feels like Mr Tullis is trying to make progress, trying to make an effort, though it does make him very tired where he is sleeping in the afternoon. [name] endorses he comes in am and pm to help his Dad to see daily progress. We talked about expectations, hope is to continue therapy, try to get more independent to functional ability. Support provided, will continue to monitor, follow. Medical goals, poc, medications reviewed. Updated staff.    History obtained from review of EMR, discussion with primary team, and interview with family, facility staff/caregiver and/or Mr. Jenne Campus.  I reviewed available labs, medications, imaging, studies and related documents from the EMR.  Records reviewed and summarized above.  Physical Exam: Constitutional: NAD General: frail appearing, thin, debilitated male, few clear words ENMT: oral mucous membranes moist CV: S1S2, RRR Pulmonary: LCTA Abdomen: normo-active BS + 4 quadrants, soft and non tender MSK: non-ambulatory Skin: warm and dry Neuro:  hemiplegia,  + cognitive impairment Psych: flat affect, A and Oriented to person Thank you for the opportunity to participate in the care of Mr. Jenne Campus. Please call our office at [phone] if we can be of additional assistance.   Tomeca Helm Ihor Gully, NP

## 2022-04-17 ENCOUNTER — Encounter: Payer: Self-pay | Admitting: Nurse Practitioner

## 2022-04-17 ENCOUNTER — Non-Acute Institutional Stay: Payer: Medicare Other | Admitting: Nurse Practitioner

## 2022-04-17 VITALS — BP 143/80 | HR 100 | Temp 97.8°F | Resp 18 | Wt 137.8 lb

## 2022-04-17 DIAGNOSIS — R634 Abnormal weight loss: Secondary | ICD-10-CM

## 2022-04-17 DIAGNOSIS — R630 Anorexia: Secondary | ICD-10-CM

## 2022-04-17 DIAGNOSIS — I639 Cerebral infarction, unspecified: Secondary | ICD-10-CM

## 2022-04-17 DIAGNOSIS — R627 Adult failure to thrive: Secondary | ICD-10-CM

## 2022-04-17 DIAGNOSIS — Z515 Encounter for palliative care: Secondary | ICD-10-CM

## 2022-04-17 DIAGNOSIS — R5381 Other malaise: Secondary | ICD-10-CM

## 2022-04-17 NOTE — Progress Notes (Signed)
[facility] Consult Note Telephone: [phone]  Fax: [phone]    Date of encounter: 04/17/22 7:04 PM PATIENT NAME: [name] [name] [address] [place] [address]   [phone] (home)  DOB: 11-25-1940 MRN: [id number] PRIMARY CARE PROVIDER:   [place], MD,  [address] [place] [address] [phone] RESPONSIBLE PARTY:    Contact Information     Name Relation Home Work Mobile   [name] [name]   [phone]         I met face to face with patient in facility. Palliative Care was asked to follow this patient by consultation request of  Tracie Harrier, MD/AHCC to address advance care planning and complex medical decision making. This is the initial visit.                                ASSESSMENT AND PLAN / RECOMMENDATIONS:  Advance Care Planning/Goals of Care: Goals include to maximize quality of life and symptom management. Patient/health care surrogate gave his/her permission to discuss.Our advance care planning conversation included a discussion about:    The value and importance of advance care planning  Experiences with loved ones who have been seriously ill or have died  Exploration of personal, cultural or spiritual beliefs that might influence medical decisions  Exploration of goals of care in the event of a sudden injury or illness  Identification  of a healthcare agent  Review and updating or creation of an  advance directive document . Decision not to resuscitate or to de-escalate disease focused treatments due to poor prognosis. CODE STATUS: DNR   Symptom Management/Plan: 1. Advance Care Planning;  DNR   2. Palliative care encounter; Palliative care encounter; Palliative medicine team will continue to support patient, patient's family, and medical team. Visit consisted of counseling and education dealing with the complex and  emotionally intense issues of symptom management and palliative care in the setting of serious and potentially life-threatening illness   3. 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 Anorexia; weights loss/adult failure to thrive reviewed weights, continue to weight. Monitor appetite, [name] [name] does require to be fed, supplements, encourage meals. Discussed at length with son about swallowing, anorexia, weights. Will continue to monitor. Currently rehydrating with IVF's 03/06/2022 weight 161.8 lbs  04/15/2022 weight 137.8 lbs 24 lbs/4 weeks; 14.83%  Follow up Palliative Care Visit: Palliative care will continue to follow for complex medical decision making, advance care planning, and clarification of goals. Return 1 to 4 weeks or prn.   I spent 45 minutes providing this consultation. More than 50% of the time in this consultation was spent in counseling and care coordination.  1 week ago PPS 40% Currently PPS: 30%   Chief Complaint: Initial palliative consult for complex medical decision making, address goals, manage ongoing symptoms   HISTORY OF PRESENT ILLNESS:  Rannie Craney is a 82 y.o. 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 Did have  hematemesis likely due to esophageal ulcer which resolved, EGD was done showed grade C reflux esophagitis and esophageal ulcer with no active bleed, biopsy done though remains pending. AKI resolved/rhabdomyloysis; afib with controlled rate. [name] [name] was stabilized  and d/c to [facility] for STR where he is currently residing. [name] [name] is non-ambulatory, requires total assistance for all adl's, incontinence bowel and bladder, requires to be fed. [name] [name] continue to try PT/OT therapy with some progress. Staff endorses it is difficult to understand [name] [name] speech. At present [name] [name] is lying in bed, appears debilitated, comfortable. [name] [name] continues to overall decline, more difficulty doing therapy, very little appetite or oral intake. He did have an IV placed for fluid resuscitation. Labs and cxr done. [name] [name] continues to sleep a lot during the day, more difficulty understanding his language. [name] [name] was cooperative with assessment. Support provided. I called [name] [name], [name] [name] son, clinical update discussed, we talked about pc visit, ivf, labs resulted and pending. We talked about dehydration, anorexia, lethargy with overall decline, debility, little participation with therapy. We talked about the possibility of another tia/cva, re-hospitalization and medical goals. We talked about hospice benefit at home, at facility, at hospice home under medicare benefit. We talked about at this time [name] [name] appears hospice eligible but not quite hospice home eligible as that time frame for admission is 2 to 3 weeks life expectance. Encouraged [name] surrounding [facility] with hospice at facility. We talked about expectations, hope is to continue therapy, try to get more independent to functional ability, though very unlikely as he continues to decline overall. We talked about IVF's for re-hydration and cycle of [name] [name] body not able to maintain hydration. Talked about quality of life with quantity vs quality. We talked about pc to continue to follow close, monitoring decline, weights, appetite, symptoms, currently comfortable.  Support provided, will continue to monitor, follow. Medical goals, poc, medications reviewed. Updated staff.    History  obtained from review of EMR, discussion with primary team, and interview with family, facility staff/caregiver and/or [name]. Jenne [name].  I reviewed available labs, medications, imaging, studies and related documents from the EMR.  Records reviewed and summarized above.  Physical Exam: Constitutional: NAD General: frail appearing, thin, debilitated male, mumbling ENMT: oral mucous membranes moist CV: S1S2, RRR Pulmonary: LCTA Abdomen: normo-active BS + 4 quadrants, soft and non tender MSK: non-ambulatory; +muscle wasting Skin: warm and dry Neuro:  hemiplegia,  + cognitive impairment Psych: flat affect, Alert, confused  Thank you for the opportunity to participate in the care of [name]. Jenne [name]. Please call our office at [phone] if we can be of additional assistance.   Srah Ake Ihor Gully, NP

## 2022-04-18 ENCOUNTER — Encounter: Payer: Self-pay | Admitting: Nurse Practitioner

## 2022-04-18 ENCOUNTER — Non-Acute Institutional Stay: Payer: Medicare Other | Admitting: Nurse Practitioner

## 2022-04-18 VITALS — BP 126/85 | HR 91 | Temp 98.4°F | Resp 18 | Wt 137.8 lb

## 2022-04-18 DIAGNOSIS — Z515 Encounter for palliative care: Secondary | ICD-10-CM

## 2022-04-18 DIAGNOSIS — R5381 Other malaise: Secondary | ICD-10-CM

## 2022-04-18 DIAGNOSIS — R634 Abnormal weight loss: Secondary | ICD-10-CM

## 2022-04-18 DIAGNOSIS — R630 Anorexia: Secondary | ICD-10-CM

## 2022-04-18 DIAGNOSIS — R627 Adult failure to thrive: Secondary | ICD-10-CM

## 2022-04-18 DIAGNOSIS — I639 Cerebral infarction, unspecified: Secondary | ICD-10-CM

## 2022-04-18 NOTE — Progress Notes (Signed)
[facility] Consult Note Telephone: [phone]  Fax: [phone]    Date of encounter: 04/18/22 12:33 PM PATIENT NAME: [name] [name] [address] [place] [address]   [phone] (home)  DOB: 1941/03/23 MRN: [id number] PRIMARY CARE PROVIDER:    [facility]  RESPONSIBLE PARTY:    Contact Information     Name Relation Home Work Mobile   [name] [name]   [phone]     I met face to face with patient in facility. Palliative Care was asked to follow this patient by consultation request of  [name] Harrier, MD/AHCC to address advance care planning and complex medical decision making. This is the initial visit.                                ASSESSMENT AND PLAN / RECOMMENDATIONS:  Advance Care Planning/Goals of Care: Goals include to maximize quality of life and symptom management. Patient/health care surrogate gave his/her permission to discuss.Our advance care planning conversation included a discussion about:    The value and importance of advance care planning  Experiences with loved ones who have been seriously ill or have died  Exploration of personal, cultural or spiritual beliefs that might influence medical decisions  Exploration of goals of care in the event of a sudden injury or illness  Identification  of a healthcare agent  Review and updating or creation of an  advance directive document . Decision not to resuscitate or to de-escalate disease focused treatments due to poor prognosis. CODE STATUS: DNR   Symptom Management/Plan: 1. Advance Care Planning;  DNR   2. Palliative care encounter; Palliative care encounter; Palliative medicine team will continue to support patient, patient's family, and medical team. Visit consisted of counseling and education dealing with the complex and emotionally intense issues of symptom management and palliative care in the setting of serious and potentially  life-threatening illness   3. 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 Anorexia; weights loss/adult failure to thrive reviewed weights, continue to weight. Monitor appetite, [name] [name] does require to be fed, supplements, encourage meals. Discussed at length with son about swallowing, anorexia, weights. Will continue to monitor. Currently rehydrating with IVF's 03/06/2022 weight 161.8 lbs  04/15/2022 weight 137.8 lbs 24 lbs/4 weeks; 14.83% 04/16/2022 sodium 139; potassium 4.3; chloride 104; Co2 23; calcium 9.1; bun 27.2; creatinine 1.25; glucose; albumin 3.2; total protein 5.2 04/17/2022 wbc 11.8; hgb 12.9; hct 38.9; platelets 343 04/17/2022 cxr; WNL Follow up Palliative Care Visit: Palliative care will continue to follow for complex medical decision making, advance care planning, and clarification of goals. Return 1 to 4 weeks or prn.   I spent 37 minutes providing this consultation start at 11:30 am. More than 50% of the time in this consultation was spent in counseling and care coordination.   1 week ago PPS 40% Currently PPS: 30%   Chief Complaint: Initial palliative consult for complex medical decision making, address goals, manage ongoing symptoms   HISTORY OF PRESENT ILLNESS:  Kipper Buch is a 82 y.o. 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 Hospitalized 03/01/2022 to 03/06/2022 after being found in his home on the floor with workup significant for MRI acute/subacute right MCA infarct and remote lacunar infarcts  of left cerebellum with left sided weakness, unable to follow commands to move extremity, difficulty with processing speech. Did have hematemesis likely due to esophageal ulcer which resolved, EGD was done showed grade C reflux esophagitis and esophageal ulcer with no active  bleed, biopsy done though remains pending. AKI resolved/rhabdomyloysis; afib with controlled rate. [name] [name] was stabilized and d/c to [facility] for STR where he is currently residing. [name] [name] is non-ambulatory, requires total assistance for all adl's, incontinence bowel and bladder, requires to be fed. [name] [name] continue to try PT/OT therapy with some progress. Staff endorses it is difficult to understand [name] [name] speech. At present [name] [name] is lying in bed, appears debilitated, comfortable. [name] [name] continues to overall decline. [name] [name] continues to receive IVF's. Reviewed labs; reviewed cxr; No meaningful discussion due to cognitive impairment. Will continue to monitor, follow weight, decline, anorexia, functional and cognitive decline, supportive role, appears comfortable at present time. Attempted to reach son, updated staff, continue monitoring. Focus on comfort  We talked about pc to continue to follow close, monitoring decline, weights, appetite, symptoms, currently comfortable.  Support provided, will continue to monitor, follow. Medical goals, poc, medications reviewed. Updated staff.    History obtained from review of EMR, discussion with primary team, and interview with family, facility staff/caregiver and/or [name]. Jenne [name].  I reviewed available labs, medications, imaging, studies and related documents from the EMR.  Records reviewed and summarized above.  Physical Exam: Constitutional: NAD General: frail appearing, thin, debilitated male, mumbling ENMT: oral mucous membranes moist CV: S1S2, RRR Pulmonary: LCTA Abdomen: normo-active BS + 4 quadrants, soft and non tender MSK: non-ambulatory; +muscle wasting Skin: warm and dry Neuro:  hemiplegia,  + cognitive impairment Psych: flat affect, Alert, confused  Thank you for the opportunity to participate in the care of [name]. Jenne [name]. Please call our office at [phone] if we can be of additional assistance.   Teigan Manner Ihor Gully, NP

## 2022-04-21 ENCOUNTER — Telehealth: Payer: Self-pay | Admitting: Nurse Practitioner

## 2022-04-21 NOTE — Telephone Encounter (Signed)
[name] [name], [name] [name] called. Updated on [name] [name] decline. We talked about goc, symptoms, overall decline, debility, appears comfortable. Continue to try therapy as able; continue to try to feed, encourage oral hydrate. We talked about continuing current plan. Therapeutic listening, emotional support provided.

## 2022-04-22 ENCOUNTER — Non-Acute Institutional Stay: Payer: Medicare Other | Admitting: Nurse Practitioner

## 2022-04-22 ENCOUNTER — Encounter: Payer: Self-pay | Admitting: Nurse Practitioner

## 2022-04-22 DIAGNOSIS — R5381 Other malaise: Secondary | ICD-10-CM

## 2022-04-22 DIAGNOSIS — Z515 Encounter for palliative care: Secondary | ICD-10-CM

## 2022-04-22 DIAGNOSIS — R634 Abnormal weight loss: Secondary | ICD-10-CM

## 2022-04-22 DIAGNOSIS — R627 Adult failure to thrive: Secondary | ICD-10-CM

## 2022-04-22 DIAGNOSIS — I639 Cerebral infarction, unspecified: Secondary | ICD-10-CM

## 2022-04-22 DIAGNOSIS — R630 Anorexia: Secondary | ICD-10-CM

## 2022-04-22 NOTE — Progress Notes (Signed)
[facility] Consult Note Telephone: [phone]  Fax: [phone]    Date of encounter: 04/22/22 5:21 PM PATIENT NAME: [name] [name] [address] [place] [address]   [phone] (home)  DOB: 1941/03/06 MRN: [id number] PRIMARY CARE PROVIDER:   [place], MD,  [address] [place] [address] [phone]  RESPONSIBLE PARTY:    Contact Information     Name Relation Home Work Mobile   [name], Trickett   [phone]        I met face to face with patient in facility. Palliative Care was asked to follow this patient by consultation request of  Tracie Harrier, MD/AHCC to address advance care planning and complex medical decision making. This is the initial visit.                                ASSESSMENT AND PLAN / RECOMMENDATIONS:  Symptom Management/Plan: 1. Advance Care Planning;  DNR   2. Palliative care encounter; Palliative care encounter; Palliative medicine team will continue to support patient, patient's family, and medical team. Visit consisted of counseling and education dealing with the complex and emotionally intense issues of symptom management and palliative care in the setting of serious and potentially life-threatening illness   3. 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 Anorexia; weights loss/adult failure to thrive reviewed weights, continue to weight. Monitor appetite, Mr Boardley does require to be fed, supplements, encourage meals. Discussed at length with son about swallowing, anorexia, weights. Will continue to monitor. Currently rehydrating with IVF's 03/06/2022 weight 161.8 lbs  04/15/2022 weight 137.8 lbs 24 lbs/4 weeks; 14.83%  Follow up Palliative Care Visit: Palliative care will continue to follow for complex medical decision making, advance care  planning, and clarification of goals. Return 1 to 5 days for close monitoring   I spent 45 minutes providing this consultation start at 11:30 am. More than 50% of the time in this consultation was spent in counseling and care coordination.   1 week ago PPS 40% Currently PPS: 30%   Chief Complaint: Initial palliative consult for complex medical decision making, address goals, manage ongoing symptoms   HISTORY OF PRESENT ILLNESS:  [name] [name] is a 82 y.o. 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 Did have hematemesis likely due to esophageal ulcer which resolved, EGD was done showed grade C reflux esophagitis and esophageal ulcer with no active bleed, biopsy done though remains pending. AKI resolved/rhabdomyloysis; afib with controlled rate. Mr Klingel was stabilized and d/c to [facility] for STR where he is currently residing. Mr Osmond is non-ambulatory, requires total assistance for all adl's, incontinence bowel and bladder, requires to be fed. Mr Parlato continue to try PT/OT therapy with no progress today per PT  Will continue to monitor, follow weight, decline, anorexia, functional and cognitive decline, supportive role, appears comfortable at present time. Attempted to reach son, updated staff, continue monitoring. Focus on comfort   We talked about  pc to continue to follow close, monitoring decline, weights, appetite, symptoms, currently comfortable.  Support provided, will continue to monitor, follow. Medical goals, poc, medications reviewed. Updated staff.    History obtained  from review of EMR, discussion with primary team, and interview with family, facility staff/caregiver and/or Mr. [name] [name].  I reviewed available labs, medications, imaging, studies and related documents from the EMR.  Records reviewed and summarized above.  Physical Exam: Constitutional: NAD General: frail appearing, thin, debilitated male, mumbling ENMT: oral mucous membranes moist CV: S1S2, RRR Pulmonary: LCTA Abdomen: normo-active BS + 4 quadrants, soft and non tender Skin: warm and dry Neuro:  hemiplegia,  + cognitive impairment Psych: flat affect, more sleepy, more difficulty with language Thank you for the opportunity to participate in the care of Mr. [name] [name]. Please call our office at [phone] if we can be of additional assistance.   Tyshawn Keel Ihor Gully, NP

## 2022-04-25 ENCOUNTER — Encounter: Payer: Self-pay | Admitting: Nurse Practitioner

## 2022-04-25 ENCOUNTER — Non-Acute Institutional Stay: Payer: Medicare Other | Admitting: Nurse Practitioner

## 2022-04-25 DIAGNOSIS — R630 Anorexia: Secondary | ICD-10-CM

## 2022-04-25 DIAGNOSIS — Z515 Encounter for palliative care: Secondary | ICD-10-CM

## 2022-04-25 DIAGNOSIS — R634 Abnormal weight loss: Secondary | ICD-10-CM

## 2022-04-25 DIAGNOSIS — R627 Adult failure to thrive: Secondary | ICD-10-CM

## 2022-04-25 DIAGNOSIS — I639 Cerebral infarction, unspecified: Secondary | ICD-10-CM

## 2022-04-25 DIAGNOSIS — R5381 Other malaise: Secondary | ICD-10-CM

## 2022-04-25 NOTE — Progress Notes (Signed)
[facility] Consult Note Telephone: [phone]  Fax: [phone]    Date of encounter: 04/25/22 7:15 PM PATIENT NAME: [name] [name] [address] [address]   [phone] (home)  DOB: 05-22-1940 MRN: [id number] PRIMARY CARE PROVIDER:   Horton, MD,  [address] [place] [address] [phone]   RESPONSIBLE PARTY:    Contact Information       Name Relation Home Work Mobile    [name] [name], [name] [name]     [phone]            I met face to face with patient in facility. Palliative Care was asked to follow this patient by consultation request of  Tracie Harrier, MD/AHCC to address advance care planning and complex medical decision making. This is the initial visit.                                ASSESSMENT AND PLAN / RECOMMENDATIONS:  Symptom Management/Plan: 1. Advance Care Planning;  DNR   2. Palliative care encounter; Palliative care encounter; Palliative medicine team will continue to support patient, patient's family, and medical team. Visit consisted of counseling and education dealing with the complex and emotionally intense issues of symptom management and palliative care in the setting of serious and potentially life-threatening illness   3. 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 Anorexia; weights loss/adult failure to thrive reviewed weights, continue to weight. Monitor appetite, [name] does require to be fed, supplements, encourage meals. Discussed at length with son about swallowing, anorexia, weights. Will continue to monitor. Currently rehydrating with IVF's 03/06/2022 weight 161.8 lbs  04/15/2022 weight 137.8 lbs 24 lbs/4 weeks; 14.83%   Follow up Palliative Care Visit: Palliative care will continue to follow for complex medical decision making, advance  care planning, and clarification of goals. Return 1 to 5 days for close monitoring   I spent 48 minutes providing this consultation start at 11:30 am. More than 50% of the time in this consultation was spent in counseling and care coordination.   1 week ago PPS 40% Currently PPS: 30%   Chief Complaint: Initial palliative consult for complex medical decision making, address goals, manage ongoing symptoms   HISTORY OF PRESENT ILLNESS:  [name] [name] is a 82 y.o. 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 Did have hematemesis likely due to esophageal ulcer which resolved, EGD was done showed grade C reflux esophagitis and esophageal ulcer with no active bleed, biopsy done though remains pending. AKI resolved/rhabdomyloysis; afib with controlled rate. [name] was stabilized and d/c to [facility] for STR where he is currently residing. [name] is non-ambulatory, requires total assistance for all adl's, incontinence bowel and bladder, requires to be fed. [name] continues to overall decline. At present [name] is lying in bed, makes brief eye contact. [name] does not appear to be in pain, appears comfortable currently. No meaningful discussion with cognitive impairment. [name]  was cooperative with assessment. Support provided. I called [name] [name], Mr Eland son, clinical update discussed. We talked about overall decline, currently remains under STR/therapy which will likely be stopping next week. We talked about overall decline, debility, progression to end of life. Will continue supportive  care, with focus on comfort. Support provided, Continue to follow close, monitor, follow weight, decline, anorexia, functional and cognitive decline, supportive role, appears comfortable at present time. Medications, medical goals, poc reviewed, updated staff.    We talked about pc to continue to follow close, monitoring decline, weights, appetite, symptoms, currently comfortable.  Support provided, will continue to monitor, follow. Medical goals, poc, medications reviewed. Updated staff.    History obtained from review of EMR, discussion with primary team, and interview with family, facility staff/caregiver and/or Mr. Jenne Campus.  I reviewed available labs, medications, imaging, studies and related documents from the EMR.  Records reviewed and summarized above.  Physical Exam: Constitutional: NAD General: frail appearing, thin, debilitated male, mumbling ENMT: oral mucous membranes moist CV: S1S2, RRR Pulmonary: LCTA Abdomen: normo-active BS + 4 quadrants, soft and non tender Skin: warm and dry Neuro:  hemiplegia,  + cognitive impairment Psych: flat affect, more sleepy, more difficulty with language  Thank you for the opportunity to participate in the care of Mr. Jenne Campus. Please call our office at [phone] if we can be of additional assistance.   [place] Ihor Gully, NP

## 2022-04-28 ENCOUNTER — Encounter: Payer: Self-pay | Admitting: Nurse Practitioner

## 2022-04-28 ENCOUNTER — Non-Acute Institutional Stay: Payer: Medicare Other | Admitting: Nurse Practitioner

## 2022-04-28 VITALS — BP 103/70 | HR 104 | Temp 97.5°F | Resp 18 | Wt 126.2 lb

## 2022-04-28 DIAGNOSIS — R627 Adult failure to thrive: Secondary | ICD-10-CM

## 2022-04-28 DIAGNOSIS — R5381 Other malaise: Secondary | ICD-10-CM

## 2022-04-28 DIAGNOSIS — R634 Abnormal weight loss: Secondary | ICD-10-CM

## 2022-04-28 DIAGNOSIS — Z515 Encounter for palliative care: Secondary | ICD-10-CM

## 2022-04-28 DIAGNOSIS — R630 Anorexia: Secondary | ICD-10-CM

## 2022-04-28 DIAGNOSIS — I639 Cerebral infarction, unspecified: Secondary | ICD-10-CM

## 2022-04-28 NOTE — Progress Notes (Addendum)
[facility] Consult Note Telephone: [phone]  Fax: [phone]    Date of encounter: 04/28/22 4:24 PM PATIENT NAME: [name] [name] [address] [place] [address]   [phone] (home)  DOB: [birth year] MRN: [id number] PRIMARY CARE PROVIDER:   [place], MD,  [address] [place] [address] [phone]   RESPONSIBLE PARTY:    Contact Information       Name Relation Home Work Mobile    Valdez, Brannan     [phone]            I met face to face with patient in facility. Palliative Care was asked to follow this patient by consultation request of  Tracie Harrier, MD/AHCC to address advance care planning and complex medical decision making. This is the initial visit.                                ASSESSMENT AND PLAN / RECOMMENDATIONS:  Symptom Management/Plan: 1. Advance Care Planning;  DNR   2. Palliative care encounter; Palliative care encounter; Palliative medicine team will continue to support patient, patient's family, and medical team. Visit consisted of counseling and education dealing with the complex and emotionally intense issues of symptom management and palliative care in the setting of serious and potentially life-threatening illness   3. 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 Anorexia; weights loss/adult failure to thrive reviewed weights, continue to weight. Monitor appetite, Mr Lampert does require to be fed, supplements, encourage meals. Discussed at length with son about swallowing, anorexia, weights. Will continue to monitor. Currently rehydrating with IVF's 03/06/2022 weight 161.8 lbs  04/15/2022 weight 137.8 lbs 04/25/2022 weight 126.2 lbs 35.6 lbs/4 weeks; 22.00% loss   Follow up Palliative Care Visit: Palliative care will continue to follow for  complex medical decision making, advance care planning, and clarification of goals. Return 1 to 5 days for close monitoring   I spent 45 minutes providing this consultation starting at 9:15am. More than 50% of the time in this consultation was spent in counseling and care coordination.   1 week ago PPS 40% Currently PPS: 30%   Chief Complaint: Initial palliative consult for complex medical decision making, address goals, manage ongoing symptoms   HISTORY OF PRESENT ILLNESS:  Tien Spooner is a 82 y.o. 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 Did have hematemesis likely due to esophageal ulcer which resolved, EGD was done showed grade C reflux esophagitis and esophageal ulcer with no active bleed, biopsy done though remains pending. AKI resolved/rhabdomyloysis; afib with controlled rate. Mr Doyon was stabilized and d/c to [facility] for STR where he is currently residing. Mr Bakos is non-ambulatory, requires total assistance for all adl's, incontinence bowel and bladder, requires to be fed. Mr Kester continues to overall decline. At present Mr Ringer is lying in bed, makes brief eye contact. Mr Lightcap does not appear to be in pain, appears comfortable currently. No meaningful discussion with  cognitive impairment. Mr Bastidas was cooperative with assessment. Support provided. Appears to continue to overall decline, sleeping more, more difficult to get Mr Polhamus to eat.   I called [name] [name], Mr Linville son, clinical update discussed, PC visit, symptoms, failure to thrive, therapy with upcoming care  plan meeting. Reviewed medical goals with focus on comfort, continue with wishes when STR stops due to overall decline, progression to appears end of life for Mr Snowdon to be d/c to hospice home. Updated [facility] SW wishes. Support provided.   Will continue supportive care, with focus on comfort. Support provided, Continue to follow close, monitor, follow weight, decline, anorexia, functional and cognitive decline, supportive role, appears comfortable at present time. Medications, medical goals, poc reviewed, updated staff.    We talked about pc to continue to follow close, monitoring decline, weights, appetite, symptoms, currently comfortable.  Support provided, will continue to monitor, follow. Medical goals, poc, medications reviewed. Updated staff.    History obtained from review of EMR, discussion with primary team, and interview with family, facility staff/caregiver and/or Mr. Jenne Campus.  I reviewed available labs, medications, imaging, studies and related documents from the EMR.  Records reviewed and summarized above.  Physical Exam: Constitutional: NAD General: frail appearing, thin, debilitated male, lethargic, temporal wasting ENMT: oral mucous membranes moist CV: S1S2, RRR Pulmonary: Poor air movement, breath sounds decreased throughout Abdomen: normo-active BS + 4 quadrants, soft and non tender Skin: warm and dry [place]: Muscle wasting Neuro:  hemiplegia,  + cognitive impairment Psych: flat affect, makes eye contact  Thank you for the opportunity to participate in the care of Mr. Jenne Campus. Please call our office at [phone] if we can be of additional assistance.   [place] Ihor Gully, NP

## 2022-05-01 DEATH — deceased
